# Patient Record
Sex: Female | Born: 1945 | Race: White | Hispanic: No | Marital: Single | Smoking: Never smoker
Health system: Southern US, Community
[De-identification: ages and names within clinical notes are randomized; demographics above are authoritative.]

## PROBLEM LIST (undated history)

## (undated) DIAGNOSIS — M1990 Unspecified osteoarthritis, unspecified site: Secondary | ICD-10-CM

## (undated) DIAGNOSIS — G2581 Restless legs syndrome: Secondary | ICD-10-CM

## (undated) DIAGNOSIS — G709 Myoneural disorder, unspecified: Secondary | ICD-10-CM

## (undated) HISTORY — PX: TONSILLECTOMY: SUR1361

## (undated) HISTORY — PX: JOINT REPLACEMENT: SHX530

## (undated) HISTORY — PX: BASAL CELL CARCINOMA EXCISION: SHX1214

---

## 2011-10-21 ENCOUNTER — Other Ambulatory Visit: Payer: Self-pay | Admitting: Orthopedic Surgery

## 2011-10-29 ENCOUNTER — Encounter (HOSPITAL_COMMUNITY): Payer: Self-pay | Admitting: Pharmacy Technician

## 2011-10-29 ENCOUNTER — Encounter (HOSPITAL_COMMUNITY)
Admission: RE | Admit: 2011-10-29 | Discharge: 2011-10-29 | Disposition: A | Discharge status: Home / Self Care | Payer: BC Managed Care – PPO | Source: Ambulatory Visit | Attending: Orthopaedic Surgery | Admitting: Orthopaedic Surgery

## 2011-10-29 ENCOUNTER — Encounter (HOSPITAL_COMMUNITY): Payer: Self-pay

## 2011-10-29 ENCOUNTER — Encounter (HOSPITAL_COMMUNITY)
Admission: RE | Admit: 2011-10-29 | Discharge: 2011-10-29 | Disposition: A | Discharge status: Home / Self Care | Payer: BC Managed Care – PPO | Source: Ambulatory Visit | Attending: Orthopedic Surgery | Admitting: Orthopedic Surgery

## 2011-10-29 ENCOUNTER — Other Ambulatory Visit: Payer: Self-pay

## 2011-10-29 HISTORY — DX: Unspecified osteoarthritis, unspecified site: M19.90

## 2011-10-29 HISTORY — DX: Restless legs syndrome: G25.81

## 2011-10-29 HISTORY — DX: Myoneural disorder, unspecified: G70.9

## 2011-10-29 LAB — DIFFERENTIAL
Basophils Absolute: 0.1 10*3/uL (ref 0.0–0.1)
Basophils Relative: 1 % (ref 0–1)
Eosinophils Absolute: 0.1 10*3/uL (ref 0.0–0.7)
Eosinophils Relative: 2 % (ref 0–5)
Lymphocytes Relative: 39 % (ref 12–46)
Lymphs Abs: 2.4 10*3/uL (ref 0.7–4.0)
Monocytes Absolute: 0.5 10*3/uL (ref 0.1–1.0)
Monocytes Relative: 8 % (ref 3–12)
Neutro Abs: 3 10*3/uL (ref 1.7–7.7)
Neutrophils Relative %: 50 % (ref 43–77)

## 2011-10-29 LAB — TYPE AND SCREEN
ABO/RH(D): A POS
Antibody Screen: NEGATIVE
Unit division: 0
Unit division: 0

## 2011-10-29 LAB — URINALYSIS, ROUTINE W REFLEX MICROSCOPIC
Bilirubin Urine: NEGATIVE
Glucose, UA: NEGATIVE mg/dL
Hgb urine dipstick: NEGATIVE
Ketones, ur: NEGATIVE mg/dL
Leukocytes, UA: NEGATIVE
Nitrite: NEGATIVE
Protein, ur: NEGATIVE mg/dL
Specific Gravity, Urine: 1.026 (ref 1.005–1.030)
Urobilinogen, UA: 0.2 mg/dL (ref 0.0–1.0)
pH: 5 (ref 5.0–8.0)

## 2011-10-29 LAB — COMPREHENSIVE METABOLIC PANEL
ALT: 14 U/L (ref 0–35)
AST: 17 U/L (ref 0–37)
Albumin: 4.1 g/dL (ref 3.5–5.2)
Alkaline Phosphatase: 73 U/L (ref 39–117)
BUN: 20 mg/dL (ref 6–23)
CO2: 27 mEq/L (ref 19–32)
Calcium: 9.9 mg/dL (ref 8.4–10.5)
Chloride: 107 mEq/L (ref 96–112)
Creatinine, Ser: 0.98 mg/dL (ref 0.50–1.10)
GFR calc Af Amer: 69 mL/min — ABNORMAL LOW (ref >90)
GFR calc non Af Amer: 60 mL/min — ABNORMAL LOW (ref >90)
Glucose, Bld: 157 mg/dL — ABNORMAL HIGH (ref 70–99)
Potassium: 4.3 mEq/L (ref 3.5–5.1)
Sodium: 142 mEq/L (ref 135–145)
Total Bilirubin: 0.3 mg/dL (ref 0.3–1.2)
Total Protein: 7.1 g/dL (ref 6.0–8.3)

## 2011-10-29 LAB — CBC
HCT: 39 % (ref 36.0–46.0)
Hemoglobin: 12.9 g/dL (ref 12.0–15.0)
MCH: 29.5 pg (ref 26.0–34.0)
MCHC: 33.1 g/dL (ref 30.0–36.0)
MCV: 89 fL (ref 78.0–100.0)
Platelets: 323 10*3/uL (ref 150–400)
RBC: 4.38 MIL/uL (ref 3.87–5.11)
RDW: 13 % (ref 11.5–15.5)
WBC: 6 10*3/uL (ref 4.0–10.5)

## 2011-10-29 LAB — SURGICAL PCR SCREEN
MRSA, PCR: NEGATIVE
Staphylococcus aureus: NEGATIVE

## 2011-10-29 LAB — APTT: aPTT: 33 seconds (ref 24–37)

## 2011-10-29 LAB — ABO/RH: ABO/RH(D): A POS

## 2011-10-29 LAB — PROTIME-INR
INR: 1 (ref 0.00–1.49)
Prothrombin Time: 13.4 seconds (ref 11.6–15.2)

## 2011-10-29 MED ORDER — CHLORHEXIDINE GLUCONATE 4 % EX LIQD
60.0000 mL | Freq: Every day | CUTANEOUS | Status: DC
  Filled 2011-10-29: qty 60

## 2011-10-29 NOTE — Pre-Procedure Instructions (Signed)
20 Cheyenne Perez  10/29/2011   Your procedure is scheduled on:  11/03/2011  Report to [facility] at 5:30 AM.  Call this number if you have problems the morning of surgery: [phone]   Remember:   Do not eat food:After Midnight.  May have clear liquids: up to 4 Hours before arrival.  Clear liquids include soda, tea, black coffee, apple or grape juice, broth.  Take these medicines the morning of surgery with A SIP OF WATER:    NOTHING   Do not wear jewelry, make-up or nail polish.  Do not wear lotions, powders, or perfumes. You may wear deodorant.  Do not shave 48 hours prior to surgery.  Do not bring valuables to the hospital.  Contacts, dentures or bridgework may not be worn into surgery.  Leave suitcase in the car. After surgery it may be brought to your room.  For patients admitted to the hospital, checkout time is 11:00 AM the day of discharge.   Patients discharged the day of surgery will not be allowed to drive home.  Name and phone number of your driver: Maggie, [place]  Special Instructions: CHG Shower Use Special Wash: 1/2 bottle night before surgery and 1/2 bottle morning of surgery.   Please read over the following fact sheets that you were given: Pain Booklet, Coughing and Deep Breathing, Blood Transfusion Information, MRSA Information and Surgical Site Infection Prevention

## 2011-10-29 NOTE — H&P (Signed)
NAME: Matteson Blue MRN: #[id number] DATE: October 21, 2011 DOB: 1945-12-15  CHIEF COMPLAINT:  Painful right hip.  HISTORY:  Ms. Krajewski is a very pleasant 66 year old white female who is seen today for evaluation of her right hip. She's had a previous history of a left total hip replacement back in 2009 by an [facility] physician, Dr. Arletha Grippe.   She did not have much in the way of any problems. However, she has noted over the last 6-8 months she's been having increasing pain and discomfort in the right hip and groin area. She is now having problems with sleeping at nighttime as well as activities of daily living. Her pain is now constant and quite severe, sharp and aching quality. It is exacerbated by activity and decreased by rest. She is having difficulty finding even any position that will make her comfortable. She does use crutches intermittently also. She is seen today for evaluation.   PAST MEDICAL HISTORY:  In general her health is good.  Hospitalizations include December 2009 for a left total hip replacement. Surgeries as above.  MEDICATIONS:  She takes Requip, dosage unknown, restless leg syndrome at nighttime. She states she takes diclofenac also for arthritis.   ALLERGIES:  None known.   REVIEW OF SYSTEMS: 14 point ROS Unremarkable except for restless leg syndrome which she has had for the past 4-5 years. She does use generic Requip for this. She denied all other pathology for the 14 point review of systems.   FAMILY HISTORY:  Not obtainable and her mother and father unknown.   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  She denies use of tobacco or alcohol.   EXAMINATION:    General:Her exam today reveals a very pleasant 66 year old white female who is well-developed and well-nourished. She is alert, pleasant and cooperative. She is in moderate distress secondary to right hip and groin pain.  Vital Signs:  She is 5 foot 5  inches and weighs 155 pounds. Temperature is 97.0. Pulse is 78. Respirations 18. Blood pressure 144/75.   Head:  Normocephalic.  Eyes: Pupils equal round and react to light and accommodation with extraocular movements intact.  Ears, nose and throat were benign.   Neck was supple and no bruits are noted.   Chest has good expansion. Lungs were clear to auscultation.   Cardiac had a regular rhythm and rate; normal S1-S2; no discrete murmurs or gallops appreciated. Her pulses were 2+ bilateral and symmetric in her lower extremities bilaterally.   CNS:  She's oriented x3 and cranial nerves II through XII are grossly intact.  Genital, Rectal, Breast exam not indicated for Orthopaedic evaluation   Musculoskeletal:  She has some minimal shortening on the right in comparison to the left at possibly quarter-inch. She has a no internal rotation on the right leg; external rotation to about 20 degrees only. She does have pain with range of motion of the right hip.   She has no pain with range of motion of her left hip.   CLINICAL IMPRESSION:   1. End-stage OA of the right hip. 2. Restless leg syndrome.  RECOMMENDATIONS:  At this time I have reviewed her note from Dr. Olena Leatherwood who feels that she would be able to tolerate a right total hip arthroplasty. Therefore we will plan on scheduling this right total hip arthroplasty. I've taken time today to review the procedure risks and benefits in detail with her. I have answered all her  questions. She would like to proceed with total hip arthroplasty on the right.  Oris Drone Santiago Bumpers, PA-C 10/29/2011 2:34 PM

## 2011-10-30 LAB — URINE CULTURE: Culture  Setup Time: 201301031239

## 2011-11-02 DIAGNOSIS — G2581 Restless legs syndrome: Secondary | ICD-10-CM

## 2011-11-02 DIAGNOSIS — M169 Osteoarthritis of hip, unspecified: Secondary | ICD-10-CM

## 2011-11-02 MED ORDER — CEFAZOLIN SODIUM 1-5 GM-% IV SOLN
1.0000 g | INTRAVENOUS | Status: AC
  Administered 2011-11-03: 1 g via INTRAVENOUS
  Filled 2011-11-02: qty 50

## 2011-11-03 ENCOUNTER — Ambulatory Visit (HOSPITAL_COMMUNITY): Payer: BC Managed Care – PPO

## 2011-11-03 ENCOUNTER — Encounter (HOSPITAL_COMMUNITY)
Admission: RE | Disposition: A | Discharge status: Home / Self Care | Payer: Self-pay | Source: Ambulatory Visit | Attending: Orthopaedic Surgery

## 2011-11-03 ENCOUNTER — Encounter (HOSPITAL_COMMUNITY): Payer: Self-pay

## 2011-11-03 ENCOUNTER — Ambulatory Visit (HOSPITAL_COMMUNITY): Payer: BC Managed Care – PPO | Admitting: Anesthesiology

## 2011-11-03 ENCOUNTER — Inpatient Hospital Stay (HOSPITAL_COMMUNITY)
Admission: RE | Admit: 2011-11-03 | Discharge: 2011-11-07 | DRG: 818 | Disposition: A | Discharge status: Home Health | Payer: BC Managed Care – PPO | Source: Ambulatory Visit | Attending: Orthopaedic Surgery | Admitting: Orthopaedic Surgery

## 2011-11-03 ENCOUNTER — Inpatient Hospital Stay (HOSPITAL_COMMUNITY): Payer: BC Managed Care – PPO

## 2011-11-03 ENCOUNTER — Encounter (HOSPITAL_COMMUNITY): Payer: Self-pay | Admitting: Anesthesiology

## 2011-11-03 DIAGNOSIS — Z01812 Encounter for preprocedural laboratory examination: Secondary | ICD-10-CM

## 2011-11-03 DIAGNOSIS — G2581 Restless legs syndrome: Secondary | ICD-10-CM | POA: Diagnosis present

## 2011-11-03 DIAGNOSIS — M169 Osteoarthritis of hip, unspecified: Secondary | ICD-10-CM

## 2011-11-03 DIAGNOSIS — E871 Hypo-osmolality and hyponatremia: Secondary | ICD-10-CM | POA: Diagnosis not present

## 2011-11-03 DIAGNOSIS — M1610 Unilateral primary osteoarthritis, unspecified hip: Principal | ICD-10-CM | POA: Diagnosis present

## 2011-11-03 DIAGNOSIS — Z01818 Encounter for other preprocedural examination: Secondary | ICD-10-CM

## 2011-11-03 DIAGNOSIS — Z01810 Encounter for preprocedural cardiovascular examination: Secondary | ICD-10-CM

## 2011-11-03 DIAGNOSIS — D62 Acute posthemorrhagic anemia: Secondary | ICD-10-CM | POA: Diagnosis not present

## 2011-11-03 HISTORY — PX: TOTAL HIP ARTHROPLASTY: SHX124

## 2011-11-03 LAB — CBC
HCT: 29.6 % — ABNORMAL LOW (ref 36.0–46.0)
Hemoglobin: 10.1 g/dL — ABNORMAL LOW (ref 12.0–15.0)
MCH: 29.5 pg (ref 26.0–34.0)
MCHC: 34.1 g/dL (ref 30.0–36.0)
MCV: 86.5 fL (ref 78.0–100.0)
Platelets: 205 10*3/uL (ref 150–400)
RBC: 3.42 MIL/uL — ABNORMAL LOW (ref 3.87–5.11)
RDW: 14 % (ref 11.5–15.5)
WBC: 10.6 10*3/uL — ABNORMAL HIGH (ref 4.0–10.5)

## 2011-11-03 LAB — GLUCOSE, CAPILLARY: Glucose-Capillary: 115 mg/dL — ABNORMAL HIGH (ref 70–99)

## 2011-11-03 SURGERY — ARTHROPLASTY, HIP, TOTAL,POSTERIOR APPROACH
Anesthesia: General | Site: Hip | Laterality: Right | Wound class: Clean

## 2011-11-03 MED ORDER — ROPINIROLE HCL 1 MG PO TABS
1.0000 mg | ORAL_TABLET | Freq: Every day | ORAL | Status: DC
  Administered 2011-11-03 – 2011-11-06 (×4): 1 mg via ORAL
  Filled 2011-11-03 (×5): qty 1

## 2011-11-03 MED ORDER — KETOROLAC TROMETHAMINE 15 MG/ML IJ SOLN: 15.0000 mg | Freq: Four times a day (QID) | INTRAMUSCULAR | Status: DC

## 2011-11-03 MED ORDER — RIVAROXABAN 10 MG PO TABS
10.0000 mg | ORAL_TABLET | ORAL | Status: DC
  Administered 2011-11-03 – 2011-11-06 (×4): 10 mg via ORAL
  Filled 2011-11-03 (×5): qty 1

## 2011-11-03 MED ORDER — ACETAMINOPHEN 10 MG/ML IV SOLN
1000.0000 mg | Freq: Four times a day (QID) | INTRAVENOUS | Status: AC
  Administered 2011-11-03 – 2011-11-04 (×3): 1000 mg via INTRAVENOUS
  Filled 2011-11-03 (×5): qty 100

## 2011-11-03 MED ORDER — EPHEDRINE SULFATE 50 MG/ML IJ SOLN
INTRAMUSCULAR | Status: DC | PRN
  Administered 2011-11-03: 5 mg via INTRAVENOUS

## 2011-11-03 MED ORDER — SODIUM CHLORIDE 0.9 % IV SOLN
INTRAVENOUS | Status: DC
  Administered 2011-11-03: 11:00:00 via INTRAVENOUS

## 2011-11-03 MED ORDER — ACETAMINOPHEN 10 MG/ML IV SOLN
INTRAVENOUS | Status: AC
Start: 2011-11-03 — End: 2011-11-03
  Filled 2011-11-03: qty 100

## 2011-11-03 MED ORDER — ONDANSETRON HCL 4 MG/2ML IJ SOLN: 4.0000 mg | Freq: Four times a day (QID) | INTRAMUSCULAR | Status: DC | PRN

## 2011-11-03 MED ORDER — DIPHENHYDRAMINE HCL 12.5 MG/5ML PO ELIX
12.5000 mg | ORAL_SOLUTION | Freq: Four times a day (QID) | ORAL | Status: DC | PRN
  Filled 2011-11-03: qty 5

## 2011-11-03 MED ORDER — HYDROMORPHONE HCL PF 1 MG/ML IJ SOLN: 0.2500 mg | INTRAMUSCULAR | Status: DC | PRN

## 2011-11-03 MED ORDER — BISACODYL 10 MG RE SUPP: 10.0000 mg | Freq: Every day | RECTAL | Status: DC | PRN

## 2011-11-03 MED ORDER — NEOSTIGMINE METHYLSULFATE 1 MG/ML IJ SOLN
INTRAMUSCULAR | Status: DC | PRN
  Administered 2011-11-03: 5 mg via INTRAVENOUS

## 2011-11-03 MED ORDER — OXYCODONE HCL 5 MG PO TABS
5.0000 mg | ORAL_TABLET | ORAL | Status: DC | PRN
  Administered 2011-11-04 – 2011-11-07 (×10): 10 mg via ORAL
  Filled 2011-11-03 (×10): qty 2

## 2011-11-03 MED ORDER — ACETAMINOPHEN 10 MG/ML IV SOLN
1000.0000 mg | INTRAVENOUS | Status: AC
  Administered 2011-11-03: 1000 mg via INTRAVENOUS
  Filled 2011-11-03: qty 100

## 2011-11-03 MED ORDER — HYDROMORPHONE 0.3 MG/ML IV SOLN
INTRAVENOUS | Status: DC
  Administered 2011-11-03: 11:00:00 via INTRAVENOUS
  Filled 2011-11-03: qty 25

## 2011-11-03 MED ORDER — DEXAMETHASONE SODIUM PHOSPHATE 10 MG/ML IJ SOLN
INTRAMUSCULAR | Status: DC | PRN
  Administered 2011-11-03: 10 mg via INTRAVENOUS

## 2011-11-03 MED ORDER — MEPERIDINE HCL 25 MG/ML IJ SOLN: 6.2500 mg | INTRAMUSCULAR | Status: DC | PRN

## 2011-11-03 MED ORDER — SODIUM CHLORIDE 0.9 % IJ SOLN: 9.0000 mL | INTRAMUSCULAR | Status: DC | PRN

## 2011-11-03 MED ORDER — PROMETHAZINE HCL 25 MG/ML IJ SOLN: 6.2500 mg | INTRAMUSCULAR | Status: DC | PRN

## 2011-11-03 MED ORDER — FENTANYL CITRATE 0.05 MG/ML IJ SOLN
INTRAMUSCULAR | Status: DC | PRN
  Administered 2011-11-03: 50 ug via INTRAVENOUS
  Administered 2011-11-03: 150 ug via INTRAVENOUS
  Administered 2011-11-03: 100 ug via INTRAVENOUS

## 2011-11-03 MED ORDER — ZOLPIDEM TARTRATE 5 MG PO TABS: 5.0000 mg | ORAL_TABLET | Freq: Every evening | ORAL | Status: DC | PRN

## 2011-11-03 MED ORDER — HETASTARCH-ELECTROLYTES 6 % IV SOLN
INTRAVENOUS | Status: DC | PRN
  Administered 2011-11-03 (×2): via INTRAVENOUS

## 2011-11-03 MED ORDER — GLYCOPYRROLATE 0.2 MG/ML IJ SOLN
INTRAMUSCULAR | Status: DC | PRN
  Administered 2011-11-03: .8 mg via INTRAVENOUS

## 2011-11-03 MED ORDER — GABAPENTIN 300 MG PO CAPS
300.0000 mg | ORAL_CAPSULE | Freq: Every day | ORAL | Status: DC
  Administered 2011-11-03 – 2011-11-06 (×4): 300 mg via ORAL
  Filled 2011-11-03 (×5): qty 1

## 2011-11-03 MED ORDER — ONDANSETRON HCL 4 MG/2ML IJ SOLN
INTRAMUSCULAR | Status: DC | PRN
  Administered 2011-11-03: 4 mg via INTRAVENOUS

## 2011-11-03 MED ORDER — SODIUM CHLORIDE 0.9 % IV SOLN: INTRAVENOUS | Status: DC

## 2011-11-03 MED ORDER — MENTHOL 3 MG MT LOZG: 1.0000 | LOZENGE | OROMUCOSAL | Status: DC | PRN

## 2011-11-03 MED ORDER — WHITE PETROLATUM GEL
Status: AC
  Filled 2011-11-03: qty 5

## 2011-11-03 MED ORDER — ONDANSETRON HCL 4 MG PO TABS
4.0000 mg | ORAL_TABLET | Freq: Four times a day (QID) | ORAL | Status: DC | PRN
  Administered 2011-11-04: 4 mg via ORAL
  Filled 2011-11-03: qty 1

## 2011-11-03 MED ORDER — LIDOCAINE HCL (CARDIAC) 20 MG/ML IV SOLN
INTRAVENOUS | Status: DC | PRN
  Administered 2011-11-03: 60 mg via INTRAVENOUS

## 2011-11-03 MED ORDER — CEFAZOLIN SODIUM 1-5 GM-% IV SOLN
1.0000 g | Freq: Four times a day (QID) | INTRAVENOUS | Status: AC
  Administered 2011-11-03 – 2011-11-04 (×3): 1 g via INTRAVENOUS
  Filled 2011-11-03 (×3): qty 50

## 2011-11-03 MED ORDER — PHENOL 1.4 % MT LIQD
1.0000 | OROMUCOSAL | Status: DC | PRN
  Filled 2011-11-03: qty 177

## 2011-11-03 MED ORDER — SODIUM CHLORIDE 0.9 % IR SOLN
Status: DC | PRN
  Administered 2011-11-03: 1000 mL

## 2011-11-03 MED ORDER — METHOCARBAMOL 100 MG/ML IJ SOLN
500.0000 mg | Freq: Four times a day (QID) | INTRAVENOUS | Status: DC | PRN
  Filled 2011-11-03: qty 5

## 2011-11-03 MED ORDER — MIDAZOLAM HCL 5 MG/5ML IJ SOLN
INTRAMUSCULAR | Status: DC | PRN
  Administered 2011-11-03 (×2): 1 mg via INTRAVENOUS

## 2011-11-03 MED ORDER — PROPOFOL 10 MG/ML IV EMUL
INTRAVENOUS | Status: DC | PRN
  Administered 2011-11-03: 140 mg via INTRAVENOUS

## 2011-11-03 MED ORDER — LACTATED RINGERS IV SOLN
INTRAVENOUS | Status: DC | PRN
  Administered 2011-11-03 (×2): via INTRAVENOUS

## 2011-11-03 MED ORDER — DEXTROSE 5 % IV SOLN
INTRAVENOUS | Status: DC | PRN
  Administered 2011-11-03: 08:00:00 via INTRAVENOUS

## 2011-11-03 MED ORDER — METHOCARBAMOL 500 MG PO TABS
500.0000 mg | ORAL_TABLET | Freq: Four times a day (QID) | ORAL | Status: DC | PRN
  Administered 2011-11-03 – 2011-11-06 (×5): 500 mg via ORAL
  Filled 2011-11-03 (×5): qty 1

## 2011-11-03 MED ORDER — PHENYLEPHRINE HCL 10 MG/ML IJ SOLN
INTRAMUSCULAR | Status: DC | PRN
  Administered 2011-11-03: 80 ug via INTRAVENOUS

## 2011-11-03 MED ORDER — DOCUSATE SODIUM 100 MG PO CAPS
100.0000 mg | ORAL_CAPSULE | Freq: Two times a day (BID) | ORAL | Status: DC
  Administered 2011-11-03 – 2011-11-07 (×9): 100 mg via ORAL
  Filled 2011-11-03 (×12): qty 1

## 2011-11-03 MED ORDER — DIPHENHYDRAMINE HCL 50 MG/ML IJ SOLN: 12.5000 mg | Freq: Four times a day (QID) | INTRAMUSCULAR | Status: DC | PRN

## 2011-11-03 MED ORDER — BUPIVACAINE-EPINEPHRINE PF 0.25-1:200000 % IJ SOLN
INTRAMUSCULAR | Status: DC | PRN
  Administered 2011-11-03: 30 mL

## 2011-11-03 MED ORDER — ROCURONIUM BROMIDE 100 MG/10ML IV SOLN
INTRAVENOUS | Status: DC | PRN
  Administered 2011-11-03: 50 mg via INTRAVENOUS
  Administered 2011-11-03 (×2): 10 mg via INTRAVENOUS

## 2011-11-03 MED ORDER — HYDROMORPHONE 0.3 MG/ML IV SOLN
INTRAVENOUS | Status: DC
  Administered 2011-11-03: 11:00:00 via INTRAVENOUS
  Administered 2011-11-03: 1 mg via INTRAVENOUS
  Administered 2011-11-03: 0.799 mg via INTRAVENOUS
  Administered 2011-11-04: 0.6 mg via INTRAVENOUS
  Administered 2011-11-04: 0.399 mg via INTRAVENOUS
  Administered 2011-11-04: 0.4 mg via INTRAVENOUS

## 2011-11-03 MED ORDER — ALUM & MAG HYDROXIDE-SIMETH 200-200-20 MG/5ML PO SUSP: 30.0000 mL | ORAL | Status: DC | PRN

## 2011-11-03 MED ORDER — NALOXONE HCL 0.4 MG/ML IJ SOLN: 0.4000 mg | INTRAMUSCULAR | Status: DC | PRN

## 2011-11-03 MED ORDER — CHLORHEXIDINE GLUCONATE 4 % EX LIQD: 60.0000 mL | Freq: Once | CUTANEOUS | Status: DC

## 2011-11-03 MED ORDER — METOCLOPRAMIDE HCL 5 MG/ML IJ SOLN
5.0000 mg | Freq: Three times a day (TID) | INTRAMUSCULAR | Status: DC | PRN
  Filled 2011-11-03: qty 2

## 2011-11-03 MED ORDER — POLYETHYLENE GLYCOL 3350 17 G PO PACK
17.0000 g | PACK | Freq: Every day | ORAL | Status: DC | PRN
  Administered 2011-11-04 – 2011-11-05 (×2): 17 g via ORAL
  Filled 2011-11-03 (×2): qty 1

## 2011-11-03 MED ORDER — METOCLOPRAMIDE HCL 10 MG PO TABS: 5.0000 mg | ORAL_TABLET | Freq: Three times a day (TID) | ORAL | Status: DC | PRN

## 2011-11-03 SURGICAL SUPPLY — 62 items
BLADE SAW SAG 73X25 THK (BLADE) ×1
BLADE SAW SGTL 73X25 THK (BLADE) ×1 IMPLANT
BRUSH FEMORAL CANAL (MISCELLANEOUS) IMPLANT
CLOTH BEACON ORANGE TIMEOUT ST (SAFETY) ×2 IMPLANT
COVER BACK TABLE 24X17X13 BIG (DRAPES) ×2 IMPLANT
COVER SURGICAL LIGHT HANDLE (MISCELLANEOUS) ×2 IMPLANT
CUP ACETBLR 52 OD PINNACLE (Hips) ×2 IMPLANT
DRAPE INCISE IOBAN 66X45 STRL (DRAPES) ×2 IMPLANT
DRAPE ORTHO SPLIT 77X108 STRL (DRAPES) ×2
DRAPE SURG ORHT 6 SPLT 77X108 (DRAPES) ×2 IMPLANT
DRSG MEPILEX BORDER 4X12 (GAUZE/BANDAGES/DRESSINGS) ×2 IMPLANT
DURAPREP 26ML APPLICATOR (WOUND CARE) ×2 IMPLANT
ELECT BLADE 6.5 EXT (BLADE) ×2 IMPLANT
ELECT REM PT RETURN 9FT ADLT (ELECTROSURGICAL) ×2
ELECTRODE REM PT RTRN 9FT ADLT (ELECTROSURGICAL) ×1 IMPLANT
ELIMINATOR HOLE APEX DEPUY (Hips) ×2 IMPLANT
EVACUATOR 1/8 PVC DRAIN (DRAIN) IMPLANT
FACESHIELD LNG OPTICON STERILE (SAFETY) ×4 IMPLANT
FEMORAL HEAD METAL ON METAL (Head) ×2 IMPLANT
Femoral Stem 12mm Small (Orthopedic Implant) ×2 IMPLANT
GLOVE BIOGEL PI IND STRL 8 (GLOVE) ×2 IMPLANT
GLOVE BIOGEL PI IND STRL 8.5 (GLOVE) ×1 IMPLANT
GLOVE BIOGEL PI INDICATOR 8 (GLOVE) ×2
GLOVE BIOGEL PI INDICATOR 8.5 (GLOVE) ×1
GLOVE ECLIPSE 8.0 STRL XLNG CF (GLOVE) ×2 IMPLANT
GLOVE SURG ORTHO 8.5 STRL (GLOVE) ×4 IMPLANT
GOWN PREVENTION PLUS XLARGE (GOWN DISPOSABLE) ×4 IMPLANT
GOWN STRL NON-REIN LRG LVL3 (GOWN DISPOSABLE) ×4 IMPLANT
HANDPIECE INTERPULSE COAX TIP (DISPOSABLE)
IMMOBILIZER KNEE 20 (SOFTGOODS)
IMMOBILIZER KNEE 20 THIGH 36 (SOFTGOODS) IMPLANT
IMMOBILIZER KNEE 22 UNIV (SOFTGOODS) IMPLANT
IMMOBILIZER KNEE 24 THIGH 36 (MISCELLANEOUS) IMPLANT
IMMOBILIZER KNEE 24 UNIV (MISCELLANEOUS)
KIT BASIN OR (CUSTOM PROCEDURE TRAY) ×2 IMPLANT
KIT ROOM TURNOVER OR (KITS) ×2 IMPLANT
LINER MARATHON 4MM 10DEG 36X52 (Hips) ×2 IMPLANT
MANIFOLD NEPTUNE II (INSTRUMENTS) ×2 IMPLANT
NEEDLE 22X1 1/2 (OR ONLY) (NEEDLE) ×2 IMPLANT
NS IRRIG 1000ML POUR BTL (IV SOLUTION) ×2 IMPLANT
PACK TOTAL JOINT (CUSTOM PROCEDURE TRAY) ×2 IMPLANT
PAD ARMBOARD 7.5X6 YLW CONV (MISCELLANEOUS) ×4 IMPLANT
PRESSURIZER FEMORAL UNIV (MISCELLANEOUS) IMPLANT
SCREW PINN CAN 6.5X20 (Screw) ×2 IMPLANT
SET HNDPC FAN SPRY TIP SCT (DISPOSABLE) IMPLANT
STAPLER VISISTAT 35W (STAPLE) ×2 IMPLANT
SUCTION FRAZIER TIP 10 FR DISP (SUCTIONS) ×2 IMPLANT
SUT BONE WAX W31G (SUTURE) IMPLANT
SUT ETHIBOND NAB CT1 #1 30IN (SUTURE) ×6 IMPLANT
SUT MNCRL AB 3-0 PS2 18 (SUTURE) ×2 IMPLANT
SUT VIC AB 0 CT1 27 (SUTURE) ×2
SUT VIC AB 0 CT1 27XBRD ANBCTR (SUTURE) ×2 IMPLANT
SUT VIC AB 1 CT1 27 (SUTURE) ×2
SUT VIC AB 1 CT1 27XBRD ANBCTR (SUTURE) ×2 IMPLANT
SUT VIC AB 2-0 CT1 27 (SUTURE) ×2
SUT VIC AB 2-0 CT1 TAPERPNT 27 (SUTURE) ×2 IMPLANT
SYR CONTROL 10ML LL (SYRINGE) ×2 IMPLANT
TOWEL OR 17X24 6PK STRL BLUE (TOWEL DISPOSABLE) ×2 IMPLANT
TOWEL OR 17X26 10 PK STRL BLUE (TOWEL DISPOSABLE) ×2 IMPLANT
TOWER CARTRIDGE SMART MIX (DISPOSABLE) IMPLANT
TRAY FOLEY CATH 14FR (SET/KITS/TRAYS/PACK) ×2 IMPLANT
WATER STERILE IRR 1000ML POUR (IV SOLUTION) ×2 IMPLANT

## 2011-11-03 NOTE — Anesthesia Preprocedure Evaluation (Addendum)
Anesthesia Evaluation  Patient identified by MRN, date of birth, ID band Patient awake    Reviewed: Allergy & Precautions, H&P , NPO status , Patient's Chart, lab work & pertinent test results  Airway Mallampati: II TM Distance: >3 FB Neck ROM: Full    Dental No notable dental hx.    Pulmonary neg pulmonary ROS,  clear to auscultation  Pulmonary exam normal       Cardiovascular neg cardio ROS Regular Normal    Neuro/Psych  Neuromuscular disease Negative Neurological ROS  Negative Psych ROS   GI/Hepatic negative GI ROS, Neg liver ROS,   Endo/Other  Negative Endocrine ROS  Renal/GU negative Renal ROS  Genitourinary negative   Musculoskeletal   Abdominal   Peds  Hematology negative hematology ROS (+)   Anesthesia Other Findings   Reproductive/Obstetrics negative OB ROS                        Anesthesia Physical Anesthesia Plan  ASA: II  Anesthesia Plan: General   Post-op Pain Management:    Induction: Intravenous  Airway Management Planned: Oral ETT  Additional Equipment:   Intra-op Plan:   Post-operative Plan: Extubation in OR  Informed Consent: I have reviewed the patients History and Physical, chart, labs and discussed the procedure including the risks, benefits and alternatives for the proposed anesthesia with the patient or authorized representative who has indicated his/her understanding and acceptance.   Dental advisory given  Plan Discussed with: Anesthesiologist and Surgeon  Anesthesia Plan Comments:         Anesthesia Quick Evaluation

## 2011-11-03 NOTE — Anesthesia Postprocedure Evaluation (Signed)
  Anesthesia Post-op Note  Patient: Cheyenne Perez  Procedure(s) Performed:  TOTAL HIP ARTHROPLASTY  Patient Location: PACU  Anesthesia Type: General  Level of Consciousness: awake  Airway and Oxygen Therapy: Patient Spontanous Breathing and Patient connected to nasal cannula oxygen  Post-op Pain: mild  Post-op Assessment: Post-op Vital signs reviewed, Patient's Cardiovascular Status Stable, Respiratory Function Stable and Patent Airway  Post-op Vital Signs: Reviewed and stable  Complications: No apparent anesthesia complications

## 2011-11-03 NOTE — Brief Op Note (Signed)
PATIENT ID:      Cheyenne Perez  MRN:     [id number] DOB/AGE:    65-Jun-1947 / 65 y.o.       OPERATIVE REPORT    DATE OF PROCEDURE:  11/03/2011       PREOPERATIVE DIAGNOSIS:   osteoarthritis right hip                                                       There is no height or weight on file to calculate BMI.     POSTOPERATIVE DIAGNOSIS:   osteoarthritis right hip                                                                     There is no height or weight on file to calculate BMI.     PROCEDURE:  Procedure(s): TOTAL HIP ARTHROPLASTY     SURGEON:  Norlene Campbell, MD    ASSISTANT:   Jacqualine Code, PA-C   (Present and scrubbed throughout the case, critical for assistance with exposure, retraction, instrumentation, and closure.)          ANESTHESIA: General     DRAINS: none :      TOURNIQUET TIME: * No tourniquets in log *    COMPLICATIONS:  None    CONDITION:  stable   Cheyenne Perez 11/03/2011, 10:07 AM

## 2011-11-03 NOTE — Transfer of Care (Signed)
Immediate Anesthesia Transfer of Care Note  Patient: Cheyenne Perez  Procedure(s) Performed:  TOTAL HIP ARTHROPLASTY  Patient Location: PACU  Anesthesia Type: General  Level of Consciousness: awake and oriented  Airway & Oxygen Therapy: Patient Spontanous Breathing and Patient connected to nasal cannula oxygen  Post-op Assessment: Report given to PACU RN, Post -op Vital signs reviewed and stable and Patient moving all extremities  Post vital signs: Reviewed and stable  Complications: No apparent anesthesia complications

## 2011-11-03 NOTE — H&P (Signed)
  The recent History & Physical has been reviewed. I have personally examined the patient today. There is no interval change to the documented History & Physical. The patient would like to proceed with the procedure.  Norlene Campbell W 11/03/2011,  7:31 AM

## 2011-11-03 NOTE — H&P (Signed)
There has been no change in health status since  the current H&P.I have examined the patient and discussed the surgery. No contraindications to the planned procedure exist.    There has been no change in health status since  the current H&P.I have examined the patient and discussed the surgery. No contraindications to the planned procedure exist.

## 2011-11-03 NOTE — Anesthesia Procedure Notes (Signed)
Procedure Name: Intubation Date/Time: 11/03/2011 7:43 AM Performed by: Julianne Rice K Pre-anesthesia Checklist: Patient identified, Timeout performed, Emergency Drugs available, Suction available and Patient being monitored Patient Re-evaluated:Patient Re-evaluated prior to inductionOxygen Delivery Method: Circle System Utilized Preoxygenation: Pre-oxygenation with 100% oxygen Intubation Type: IV induction Ventilation: Mask ventilation without difficulty Laryngoscope Size: Miller and 2 Grade View: Grade II Tube type: Oral Tube size: 7.5 mm Number of attempts: 1 Airway Equipment and Method: stylet Placement Confirmation: ETT inserted through vocal cords under direct vision,  positive ETCO2 and breath sounds checked- equal and bilateral Secured at: 21 cm Tube secured with: Tape Dental Injury: Teeth and Oropharynx as per pre-operative assessment

## 2011-11-03 NOTE — Op Note (Signed)
PATIENT ID:      Cheyenne Perez  MRN:     [id number] DOB/AGE:    [birth year] / 66 y.o.       OPERATIVE REPORT   DATE OF PROCEDURE:  11/03/2011       PREOPERATIVE DIAGNOSIS:   osteoarthritis right hip                                                       There is no height or weight on file to calculate BMI.     POSTOPERATIVE DIAGNOSIS:   osteoarthritis right hip                                                                     There is no height or weight on file to calculate BMI.     PROCEDURE:  Procedure(s):RIGHT TOTAL HIP ARTHROPLASTY     SURGEON: Norlene Campbell, MD  11/03/2011, 9:40 AM    ASSISTANT:   Jacqualine Code, PA-C   (Present and scrubbed throughout the case, critical for assistance with exposure, retraction, instrumentation, and closure.)          ANESTHESIA:general     DRAINS: none    COMPLICATIONS:  None        COMPONENTS:  DePuy AML small stature 12-mm femoral component         A 36 -mm  outer diameter hip ball          A 5 mm neck length                               A 52-mm outer  diameter sector IIl Porocoat acetabular shell with an apex hole eliminator          A pedicle Marathon polyethylene liner  +4, 10-degree posterior lip.           Components were Press-Fit.                                A single 6.25mmx 20mm acetabular screw  PROCEDURE IN DETAIL:  The patient was met in the holding area and personally  identified.  The appropriate hip was identified and marked at the operative site.The patient was then transported to room #1 and placed under  General anesthesia. A foley catheter was inserted by the nursing staff.Urine was clear.  At that point, the patient was placed in the lateral decubitus position with the operative side up and secured to the operating room table with the Innomed hip system.      The operative lower extremity was prepped from the iliac crest to the distal leg with Betadine scrub and then DuraPrep.  Sterile draping was performed.      A  routine southern incision was utilized and via sharp dissection carried down to the subcutaneous tissue.  Gross bleeders were Bovie coagulated.  The iliotibial band was identified and incised along the length of the skin incision.  Self-retaining retractors were  inserted.  With the hip internally rotated, the short external rotators were identified. Tendinous structures were tagged with 0 Ethibond suture.  The hip capsule was identified and incised along the femoral neck and head.  There was a small clear yellow joint effusion.  Hip was easily dislocated posteriorly.  The femoral neck was then osteotomized using a calcar guide and removed from the wound.  Synovectomy was  performed from the acetabulum. There was a moderate beefy red synovitis.      The osteotomy was placed about 10 mm proximal to the lesser trochanter.  A starter hole was then made through the piriformis fossa.  Reaming was performed to 12mm..  I had nice endosteal  purchase.  Rasping was performed sequentially to 12 mm.A small non propagating crack in the calcar was identified.      Retractors were then placed about the acetabulum.  Further synovectomy was performed.  There was a large degenerative labrum that was also excised.  .  Reaming was performed sequentially to 51 mm to accept a 52mm component. It had very nice bleeding circumferentially and a nice strong thick acetabulum.  I then trialed the acetabular component.  It had complete seating.  Accordingly, acetabular component was impacted into the acetabulum.  It was a very nice fit and stable.       The trial polyethylene liner was inserted followed by the femoral rasp.  We trialed a     number of neck lengths and felt like trial 1.85mm neck and ball was the most stable.  At that point, there was minimal toggling and incomplete stability in extension The acetabular component was repositioned with resultant stability.  Leg lengths were appropriate.     The trial components were then  removed.  The joint was copiously irrigated with saline solution.  Apex hole eliminator was inserted into the acetabular component followed a 6.27mmx20mm acetabular screw. There was minimal protrusion palpated in the posterior acetabulum. The final Marathon polyethylene liner.was inserted without issue.      The femoral component was then impacted onto the calcar.  Wound was again   irrigated.  We again trialed using the 36mm head and felt that it was perfectly stable with a 5mm neck length The right leg seemed about 2-33mm longer, but more stable with the 5mm neck..      The trial head was then removed.  We cleaned the [facility] taper neck and inserted the final head.  This was reduced, and through a full range of motion, it was perfectly  Stable  and there was no subluxation.  There was no evidence of instability.  It had a very nice construct.      Wound was then irrigated with saline solution.  The capsule was closed anatomically with #1 Ethibond.  The short external rotators were closed with similar material.  The wound was again irrigated with saline solution.  The iliotibial band was closed with  running #1 Vicryl, subcu was closed with 2-0 Vicryl and 3-0 Monocryl, skin was closed with skin clips.  Sterile bulky dressing was applied followed by a knee immobilizer.  The patient was then placed in the supine position, awoken, placed on the operating  stretcher, and returned to the  postanesthesia recovery room in satisfactory condition.     Norlene Campbell, MD  11/03/2011, 9:40 AM  11/03/2011 9:40 AM

## 2011-11-03 NOTE — Preoperative (Signed)
Beta Blockers   Reason not to administer Beta Blockers:Not Applicable 

## 2011-11-04 ENCOUNTER — Encounter (HOSPITAL_COMMUNITY): Payer: Self-pay | Admitting: Orthopaedic Surgery

## 2011-11-04 DIAGNOSIS — D62 Acute posthemorrhagic anemia: Secondary | ICD-10-CM | POA: Diagnosis not present

## 2011-11-04 LAB — CBC
HCT: 25.7 % — ABNORMAL LOW (ref 36.0–46.0)
HCT: 27.2 % — ABNORMAL LOW (ref 36.0–46.0)
Hemoglobin: 8.7 g/dL — ABNORMAL LOW (ref 12.0–15.0)
Hemoglobin: 9.1 g/dL — ABNORMAL LOW (ref 12.0–15.0)
MCH: 29.5 pg (ref 26.0–34.0)
MCH: 29.4 pg (ref 26.0–34.0)
MCHC: 33.9 g/dL (ref 30.0–36.0)
MCHC: 33.5 g/dL (ref 30.0–36.0)
MCV: 87.1 fL (ref 78.0–100.0)
MCV: 87.7 fL (ref 78.0–100.0)
Platelets: 213 10*3/uL (ref 150–400)
Platelets: 221 10*3/uL (ref 150–400)
RBC: 2.95 MIL/uL — ABNORMAL LOW (ref 3.87–5.11)
RBC: 3.1 MIL/uL — ABNORMAL LOW (ref 3.87–5.11)
RDW: 14.8 % (ref 11.5–15.5)
RDW: 14.9 % (ref 11.5–15.5)
WBC: 9.6 10*3/uL (ref 4.0–10.5)
WBC: 12.5 10*3/uL — ABNORMAL HIGH (ref 4.0–10.5)

## 2011-11-04 LAB — BASIC METABOLIC PANEL
BUN: 10 mg/dL (ref 6–23)
CO2: 27 mEq/L (ref 19–32)
Calcium: 7.8 mg/dL — ABNORMAL LOW (ref 8.4–10.5)
Chloride: 106 mEq/L (ref 96–112)
Creatinine, Ser: 0.8 mg/dL (ref 0.50–1.10)
GFR calc Af Amer: 88 mL/min — ABNORMAL LOW (ref >90)
GFR calc non Af Amer: 76 mL/min — ABNORMAL LOW (ref >90)
Glucose, Bld: 136 mg/dL — ABNORMAL HIGH (ref 70–99)
Potassium: 4 mEq/L (ref 3.5–5.1)
Sodium: 138 mEq/L (ref 135–145)

## 2011-11-04 MED ORDER — ACETAMINOPHEN 10 MG/ML IV SOLN
1000.0000 mg | Freq: Four times a day (QID) | INTRAVENOUS | Status: AC
  Administered 2011-11-04 – 2011-11-05 (×3): 1000 mg via INTRAVENOUS
  Filled 2011-11-04 (×5): qty 100

## 2011-11-04 NOTE — Progress Notes (Signed)
Occupational Therapy Evaluation Patient Details Name: Cheyenne Perez MRN: [id number] DOB: August 24, 1946 Today's Date: 11/04/2011  Problem List:  Patient Active Problem List  Diagnoses  . Osteoarthritis of hip  . Restless leg syndrome  . Postoperative anemia due to acute blood loss    Past Medical History:  Past Medical History  Diagnosis Date  . Diabetes mellitus     no meds yet, told that she has borderline concern  . Neuromuscular disorder     neuropathy- R side pain- hip- down leg  . Arthritis     osteo- hip-R  . Restless leg    Past Surgical History:  Past Surgical History  Procedure Date  . Joint replacement     L hip- 2009  . Basal cell carcinoma excision     L lower eyelid  . Tonsillectomy     as a child  . Total hip arthroplasty 11/03/2011    Procedure: TOTAL HIP ARTHROPLASTY;  Surgeon: Valeria Batman, MD;  Location: [facility] OR;  Service: Orthopedics;  Laterality: Right;    OT Assessment/Plan/Recommendation OT Assessment Clinical Impression Statement: Pt. with Rt. THA and living alone PTA, resulting in pt. needing to be mod I or supervision at D/C to facilitate safe D/C home with pt's nephew coming to check on her intermittently during the day. OT Recommendation/Assessment: Patient will need skilled OT in the acute care venue OT Problem List: Decreased activity tolerance;Pain;Decreased knowledge of use of DME or AE;Decreased knowledge of precautions;Decreased safety awareness Barriers to Discharge: None OT Therapy Diagnosis : Acute pain OT Plan OT Frequency: Min 2X/week OT Treatment/Interventions: Self-care/ADL training;DME and/or AE instruction;Patient/family education;Balance training OT Recommendation Follow Up Recommendations: Home health OT;Supervision - Intermittent Equipment Recommended: None recommended by OT Individuals Consulted Consulted and Agree with Results and Recommendations: Patient OT Goals Acute Rehab OT Goals OT Goal Formulation: With  patient Time For Goal Achievement: 2 weeks ADL Goals Pt Will Perform Grooming: with modified independence;Standing at sink ADL Goal: Grooming - Progress: Progressing toward goals Pt Will Perform Lower Body Bathing: with supervision;with set-up;Sit to stand from bed ADL Goal: Lower Body Bathing - Progress: Not met Pt Will Perform Lower Body Dressing: with set-up;with supervision;with adaptive equipment;Sit to stand from bed ADL Goal: Lower Body Dressing - Progress: Progressing toward goals Pt Will Transfer to Toilet: with modified independence;Ambulation;with DME;3-in-1 ADL Goal: Toilet Transfer - Progress: Progressing toward goals Pt Will Perform Toileting - Hygiene: with modified independence;Standing at 3-in-1/toilet ADL Goal: Toileting - Hygiene - Progress: Progressing toward goals  OT Evaluation Precautions/Restrictions  Precautions Precautions: Posterior Hip Precaution Booklet Issued: No Required Braces or Orthoses: Yes Knee Immobilizer: Other (comment) (Rt LE while in bed) Restrictions Weight Bearing Restrictions: Yes RLE Weight Bearing: Weight bearing as tolerated Prior Functioning Home Living Lives With: Alone Receives Help From: Family Type of Home: House Home Layout: One level Home Access: Stairs to enter Secretary/administrator of Steps: 1 Bathroom Shower/Tub: Other (comment) (Pt. sponge bathes) Bathroom Toilet: Standard Bathroom Accessibility: Yes How Accessible: Accessible via walker Home Adaptive Equipment: Walker - standard Prior Function Level of Independence: Independent with basic ADLs;Independent with homemaking with ambulation;Independent with gait;Independent with transfers Able to Take Stairs?: Yes Driving: Yes ADL ADL Eating/Feeding: Simulated;Independent Where Assessed - Eating/Feeding: Chair Grooming: Performed Where Assessed - Grooming: Sitting, chair Upper Body Bathing: Simulated;Chest;Right arm;Left arm;Abdomen;Modified independent Where  Assessed - Upper Body Bathing: Sitting, chair Lower Body Bathing: Moderate assistance Where Assessed - Lower Body Bathing: Sit to stand from chair Upper Body Dressing: Performed;Set up  Upper Body Dressing Details (indicate cue type and reason): with donning gown Where Assessed - Upper Body Dressing: Sitting, chair Lower Body Dressing: Simulated;Maximal assistance Lower Body Dressing Details (indicate cue type and reason): Educated pt. Re- AE Where Assessed - Lower Body Dressing: Sitting, chair Toilet Transfer: Minimal assistance;Performed Toilet Transfer Details (indicate cue type and reason): Mod verbal cues for hand placement and technique for maintaining precautions and safety with hand placement Toilet Transfer Method: Ambulating Toilet Transfer Equipment: Bedside commode (commode over regular toilet) Toileting - Clothing Manipulation: Performed;Minimal assistance Toileting - Clothing Manipulation Details (indicate cue type and reason): With moving gown Where Assessed - Toileting Clothing Manipulation: Sit to stand from 3-in-1 or toilet Toileting - Hygiene: Performed;Set up Where Assessed - Toileting Hygiene: Standing Tub/Shower Transfer: Not assessed Tub/Shower Transfer Method: Not assessed Equipment Used: Rolling walker Ambulation Related to ADLs: Pt. provided with close supervision with mobility in the room with RW use and min verbal cues for WBAT Rt. LE. ADL Comments: Pt. educated on techniques for completing LB ADLs with AE to increase independence.   Extremity Assessment RUE Assessment RUE Assessment: Within Functional Limits LUE Assessment LUE Assessment: Within Functional Limits Mobility  Bed Mobility Bed Mobility: No Supine to Sit: Not tested (comment) Sitting - Scoot to Edge of Bed: Not tested (comment) Sit to Supine: 4: Min assist Sit to Supine - Details (indicate cue type and reason): Assist for right LE to maintain posterior hip precautions.  Cues for  sequence. Transfers Transfers: Yes Sit to Stand: 4: Min assist Sit to Stand Details (indicate cue type and reason): Guarding for balance with cues for hand and right LE placement. Stand to Sit: 4: Min assist (Min (guard)) Stand to Sit Details: Guarding for balance with cues for hand and right LE placement. Exercises Total Joint Exercises Ankle Circles/Pumps: AROM;Right;10 reps;Supine Quad Sets: AROM;Right;10 reps;Supine Gluteal Sets: AROM;Both;10 reps;Supine Heel Slides: AAROM;Right;10 reps;Supine Hip ABduction/ADduction: AAROM;Right;10 reps;Supine End of Session OT - End of Session Equipment Utilized During Treatment: Gait belt Activity Tolerance: Patient tolerated treatment well Patient left: in chair;with call bell in reach Nurse Communication: Mobility status for transfers General Behavior During Session: [facility] for tasks performed Cognition: [facility] for tasks performed   Liliana Brentlinger, OTR/L Pager [phone] 11/04/2011, 2:56 PM

## 2011-11-04 NOTE — Progress Notes (Signed)
Utilization review completed. Trishna Cwik, RN, BSN. 11/04/11  

## 2011-11-04 NOTE — Progress Notes (Signed)
Pharmacy- Xarelto patient education was completed.  Pt expressed understanding.

## 2011-11-04 NOTE — Progress Notes (Signed)
Orthopedic Tech Progress Note Patient Details:  Cheyenne Perez Jul 18, 1946 [id number]      [name] 11/04/2011, 6:51 PM Applied overhead frame to patient bed

## 2011-11-04 NOTE — Progress Notes (Signed)
Patient ID: Cheyenne Perez, female   DOB: 1945/11/04, 66 y.o.   MRN: [id number] PATIENT ID:      Cheyenne Perez  MRN:     [id number] DOB/AGE:    07/01/1946 / 66 y.o.    PROGRESS NOTE Subjective:  negative for Chest Pain  negative for Shortness of Breath  negative for Nausea/Vomiting   negative for Calf Pain  positive for Bowel Movement   Tolerating Diet: yes         Patient reports pain as mild.    Objective: Vital signs in last 24 hours:  Patient Vitals for the past 24 hrs:  BP Temp Temp src Pulse Resp SpO2  11/04/11 0543 120/50 mmHg 99.1 F (37.3 C) - 90  18  97 %  11/04/11 0215 120/58 mmHg 98 F (36.7 C) - 70  18  96 %  11/04/11 0005 - - - - 16  -  11/03/11 2324 - - - - 16  -  11/03/11 2144 118/53 mmHg 98.1 F (36.7 C) - 87  18  94 %  11/03/11 1623 148/67 mmHg 97.7 F (36.5 C) Oral 72  16  100 %  11/03/11 1600 - - - - 16  98 %  11/03/11 1238 140/56 mmHg 97.6 F (36.4 C) - 86  18  100 %  11/03/11 1045 157/68 mmHg 97.4 F (36.3 C) - 81  16  100 %  11/03/11 1030 159/68 mmHg - - 85  14  100 %  11/03/11 1025 153/68 mmHg - - - - -  11/03/11 1015 - - - 93  20  100 %  11/03/11 1010 165/65 mmHg 97.6 F (36.4 C) - 95  31  100 %      Intake/Output from previous day:   01/08 0701 - 01/09 0700 In: 4270 [P.O.:120; I.V.:2600] Out: 4000 [Urine:2900]   Intake/Output this shift:       Intake/Output      01/08 0701 - 01/09 0700 01/09 0701 - 01/10 0700   P.O. 120    I.V. 2600    Blood 350    IV Piggyback 1200    Total Intake 4270    Urine 2900    Blood 1100    Total Output 4000    Net +270            LABORATORY DATA:  Basename 11/04/11 0720 11/03/11 1020 10/29/11 1056  WBC 9.6 10.6* 6.0  HGB 8.7* 10.1* 12.9  HCT 25.7* 29.6* 39.0  PLT 213 205 323    Basename 10/29/11 1056  NA 142  K 4.3  CL 107  CO2 27  BUN 20  CREATININE 0.98  GLUCOSE 157*  CALCIUM 9.9   Lab Results  Component Value Date   INR 1.00 10/29/2011    Examination:  General appearance: alert  and mild distress Resp: clear to auscultation bilaterally Cardio: regular rate and rhythm GI: normal findings: bowel sounds normal  Wound Exam: clean, dry, intact   Drainage:  None: wound tissue dry  Motor Exam EHL, FHL, Anterior Tibial and Posterior Tibial Intact  Sensory Exam Superficial Peroneal, Deep Peroneal and Tibial normal  Assessment:    1 Day Post-Op  Procedure(s) (LRB): TOTAL HIP ARTHROPLASTY (Right)  ADDITIONAL DIAGNOSIS:  Principal Problem:  *Osteoarthritis of hip Active Problems:  Restless leg syndrome  Postoperative anemia due to acute blood loss  Acute Blood Loss Anemia   Plan: Physical Therapy as ordered Weight Bearing as Tolerated (WBAT)  DVT Prophylaxis:  Xarelto,  Foot Pumps and TED hose  DISCHARGE PLAN: Home  DISCHARGE NEEDS: HHPT, Walker and 3-in-1 comode seat  REPEAT CBC AT 3PM SALINE LOCK IV CONTINUE OFIRMEV D/C O2         Cheyenne Perez 11/04/2011, 8:10 AM

## 2011-11-04 NOTE — Progress Notes (Signed)
CARE MANAGEMENT NOTE 11/04/2011 Discharge planning. Spoke with patient, Choice offered. Patient has used [facility] in [place]. Contacted Ava @ [employer]-[phone]. PT will be avail on Monday 11/09/11.Marland Kitchen Patient will be discharged on Xarelto 10 mg., contacted her pharmacy- [facility] in [phone], spoke with Misty Stanley. They have Xarelto 10 mg. in stock.

## 2011-11-04 NOTE — Progress Notes (Signed)
Physical Therapy Evaluation Patient Details Name: Cheyenne Perez MRN: [id number] DOB: [birth year] Today's Date: 11/04/2011  Problem List:  Patient Active Problem List  Diagnoses  . Osteoarthritis of hip  . Restless leg syndrome  . Postoperative anemia due to acute blood loss    Past Medical History:  Past Medical History  Diagnosis Date  . Diabetes mellitus     no meds yet, told that she has borderline concern  . Neuromuscular disorder     neuropathy- R side pain- hip- down leg  . Arthritis     osteo- hip-R  . Restless leg    Past Surgical History:  Past Surgical History  Procedure Date  . Joint replacement     L hip- 2009  . Basal cell carcinoma excision     L lower eyelid  . Tonsillectomy     as a child  . Total hip arthroplasty 11/03/2011    Procedure: TOTAL HIP ARTHROPLASTY;  Surgeon: Valeria Batman, MD;  Location: [facility] OR;  Service: Orthopedics;  Laterality: Right;    PT Assessment/Plan/Recommendation PT Assessment Clinical Impression Statement: Pt is a 66 y/o female admitted s/p right THA along with the below PT problem list.  Pt would benefit from acute PT to maximize independence and facilitate d/c home with HHPT. PT Recommendation/Assessment: Patient will need skilled PT in the acute care venue PT Problem List: Decreased strength;Decreased activity tolerance;Decreased balance;Decreased mobility;Decreased knowledge of use of DME;Decreased knowledge of precautions;Pain Barriers to Discharge: Decreased caregiver support PT Therapy Diagnosis : Difficulty walking;Acute pain PT Plan PT Frequency: 7X/week PT Treatment/Interventions: DME instruction;Gait training;Stair training;Functional mobility training;Therapeutic activities;Therapeutic exercise;Balance training;Patient/family education PT Recommendation Follow Up Recommendations: Home health PT Equipment Recommended: None recommended by PT PT Goals  Acute Rehab PT Goals PT Goal Formulation: With patient Time For  Goal Achievement: 7 days Pt will go Supine/Side to Sit: with modified independence PT Goal: Supine/Side to Sit - Progress: Not met Pt will go Sit to Supine/Side: with modified independence PT Goal: Sit to Supine/Side - Progress: Not met Pt will go Sit to Stand: with modified independence PT Goal: Sit to Stand - Progress: Not met Pt will go Stand to Sit: with modified independence PT Goal: Stand to Sit - Progress: Not met Pt will Ambulate: >150 feet;with modified independence;with least restrictive assistive device PT Goal: Ambulate - Progress: Not met Pt will Go Up / Down Stairs: 1-2 stairs;with modified independence;with least restrictive assistive device PT Goal: Up/Down Stairs - Progress: Not met Pt will Perform Home Exercise Program: Independently PT Goal: Perform Home Exercise Program - Progress: Not met  PT Evaluation Precautions/Restrictions  Precautions Precautions: Posterior Hip Precaution Booklet Issued: Yes (comment) (Posterior Hip Precautions given on evaluation.) Required Braces or Orthoses: Yes Knee Immobilizer: Other (comment) (Right LE while in bed.) Restrictions Weight Bearing Restrictions: Yes RLE Weight Bearing: Weight bearing as tolerated Prior Functioning  Home Living Lives With: Alone Receives Help From: Family Type of Home: House Home Layout: One level Home Access: Stairs to enter [employer] of Steps: 1 Home Adaptive Equipment: Walker - standard Prior Function Level of Independence: Independent with basic ADLs;Independent with homemaking with ambulation;Independent with gait;Independent with transfers Able to Take Stairs?: Yes Driving: Yes Cognition Cognition Arousal/Alertness: Awake/alert Overall Cognitive Status: Appears within functional limits for tasks assessed Orientation Level: Oriented X4 Sensation/Coordination Sensation Light Touch: Appears Intact Stereognosis: Not tested Hot/Cold: Not tested Proprioception: Not  tested Coordination Gross Motor Movements are Fluid and Coordinated: Yes Fine Motor Movements are Fluid and Coordinated:  Yes Extremity Assessment RUE Assessment RUE Assessment: Not tested LUE Assessment LUE Assessment: Not tested RLE Assessment RLE Assessment: Exceptions to [facility] RLE Strength RLE Overall Strength: Due to pain RLE Overall Strength Comments: 3+/5 LLE Assessment LLE Assessment: Within Functional Limits Pain 2/10 in right hip.  Pt repositioned with ice applied after treatment. Mobility (including Balance) Bed Mobility Bed Mobility: Yes Supine to Sit: 3: Mod assist;With rails;HOB flat Supine to Sit Details (indicate cue type and reason): Assist for right LE and trunk with cues for sequence using bridging. Sitting - Scoot to Edge of Bed: 4: Min assist Sitting - Scoot to [employer] of Bed Details (indicate cue type and reason): Assist for right LE with cues for sequence. Transfers Transfers: Yes Sit to Stand: 4: Min assist;With upper extremity assist;From bed Sit to Stand Details (indicate cue type and reason): Assist to bring trunk over BOS with cues for hand and right LE placement. Stand to Sit: 4: Min assist;With upper extremity assist;To chair/3-in-1 Stand to Sit Details: Assist to slow eccentric descent to chair with cues for hand and right LE placement. Ambulation/Gait Ambulation/Gait: Yes Ambulation/Gait Assistance: 4: Min assist Ambulation/Gait Assistance Details (indicate cue type and reason): Assist for balance with cues for sequence with right LE inside RW. Ambulation Distance (Feet): 30 Feet Assistive device: Rolling walker Gait Pattern: Step-to pattern;Decreased step length - right;Left flexed knee in stance;Trunk flexed Stairs: No Wheelchair Mobility Wheelchair Mobility: No  Posture/Postural Control Posture/Postural Control: No significant limitations Balance Balance Assessed: No Exercise  Total Joint Exercises Ankle Circles/Pumps: AROM;Right;10  reps;Supine Quad Sets: AROM;Right;10 reps;Supine Gluteal Sets: AROM;Both;10 reps;Supine Heel Slides: AAROM;Right;10 reps;Supine End of Session PT - End of Session Equipment Utilized During Treatment: Gait belt Activity Tolerance: Patient tolerated treatment well Patient left: in chair;with call bell in reach Nurse Communication: Mobility status for transfers;Mobility status for ambulation General Behavior During Session: [facility] for tasks performed Cognition: [facility] for tasks performed  Cephus Shelling 11/04/2011, 10:50 AM  11/04/2011 Cephus Shelling, PT, DPT [phone]

## 2011-11-04 NOTE — Progress Notes (Signed)
Physical Therapy Treatment Patient Details Name: Cheyenne Perez MRN: [id number] DOB: February 10, 1946 Today's Date: 11/04/2011  PT Assessment/Plan  PT - Assessment/Plan Comments on Treatment Session: Pt admitted for right THA and tolerating mobility well.  Pt progressing and very motivated. PT Plan: Discharge plan remains appropriate;Frequency remains appropriate PT Frequency: 7X/week Follow Up Recommendations: Home health PT Equipment Recommended: None recommended by PT PT Goals  Acute Rehab PT Goals PT Goal Formulation: With patient Time For Goal Achievement: 7 days PT Goal: Sit to Supine/Side - Progress: Progressing toward goal PT Goal: Sit to Stand - Progress: Progressing toward goal PT Goal: Stand to Sit - Progress: Progressing toward goal PT Goal: Ambulate - Progress: Progressing toward goal PT Goal: Perform Home Exercise Program - Progress: Progressing toward goal  PT Treatment Precautions/Restrictions  Precautions Precautions: Posterior Hip Precaution Booklet Issued: No Required Braces or Orthoses: Yes Knee Immobilizer: Other (comment) (Right LE while in bed.) Restrictions Weight Bearing Restrictions: Yes RLE Weight Bearing: Weight bearing as tolerated Pain 0/10 with treatment. Mobility (including Balance) Bed Mobility Bed Mobility: Yes Supine to Sit: Not tested (comment) Sitting - Scoot to Edge of Bed: Not tested (comment) Sit to Supine: 4: Min assist Sit to Supine - Details (indicate cue type and reason): Assist for right LE to maintain posterior hip precautions.  Cues for sequence. Transfers Transfers: Yes Sit to Stand: 4: Min assist (Min (guard)) Sit to Stand Details (indicate cue type and reason): Guarding for balance with cues for hand and right LE placement. Stand to Sit: 4: Min assist (Min (guard)) Stand to Sit Details: Guarding for balance with cues for hand and right LE placement. Ambulation/Gait Ambulation/Gait: Yes Ambulation/Gait Assistance: 4: Min assist  (Min (guard)) Ambulation/Gait Assistance Details (indicate cue type and reason): Guarding for balance with cues for sequence. Ambulation Distance (Feet): 40 Feet Assistive device: Rolling walker Gait Pattern: Step-to pattern;Decreased step length - right;Left flexed knee in stance;Trunk flexed Stairs: No Wheelchair Mobility Wheelchair Mobility: No  Posture/Postural Control Posture/Postural Control: No significant limitations Balance Balance Assessed: No Exercise  Total Joint Exercises Ankle Circles/Pumps: AROM;Right;10 reps;Supine Quad Sets: AROM;Right;10 reps;Supine Gluteal Sets: AROM;Both;10 reps;Supine Heel Slides: AAROM;Right;10 reps;Supine Hip ABduction/ADduction: AAROM;Right;10 reps;Supine End of Session PT - End of Session Equipment Utilized During Treatment: Gait belt Activity Tolerance: Patient tolerated treatment well Patient left: in bed;with call bell in reach Nurse Communication: Mobility status for transfers;Mobility status for ambulation General Behavior During Session: [facility] for tasks performed Cognition: [facility] for tasks performed  Cephus Shelling 11/04/2011, 2:42 PM  11/04/2011 Cephus Shelling, PT, DPT [phone]

## 2011-11-05 LAB — BASIC METABOLIC PANEL
BUN: 9 mg/dL (ref 6–23)
CO2: 26 mEq/L (ref 19–32)
Calcium: 7.8 mg/dL — ABNORMAL LOW (ref 8.4–10.5)
Chloride: 102 mEq/L (ref 96–112)
Creatinine, Ser: 0.89 mg/dL (ref 0.50–1.10)
GFR calc Af Amer: 78 mL/min — ABNORMAL LOW (ref >90)
GFR calc non Af Amer: 67 mL/min — ABNORMAL LOW (ref >90)
Glucose, Bld: 132 mg/dL — ABNORMAL HIGH (ref 70–99)
Potassium: 3.7 mEq/L (ref 3.5–5.1)
Sodium: 133 mEq/L — ABNORMAL LOW (ref 135–145)

## 2011-11-05 LAB — CBC
HCT: 25 % — ABNORMAL LOW (ref 36.0–46.0)
HCT: 30.5 % — ABNORMAL LOW (ref 36.0–46.0)
Hemoglobin: 8.3 g/dL — ABNORMAL LOW (ref 12.0–15.0)
Hemoglobin: 10.3 g/dL — ABNORMAL LOW (ref 12.0–15.0)
MCH: 29.3 pg (ref 26.0–34.0)
MCH: 29.4 pg (ref 26.0–34.0)
MCHC: 33.2 g/dL (ref 30.0–36.0)
MCHC: 33.8 g/dL (ref 30.0–36.0)
MCV: 88.3 fL (ref 78.0–100.0)
MCV: 87.1 fL (ref 78.0–100.0)
Platelets: 181 10*3/uL (ref 150–400)
Platelets: 196 10*3/uL (ref 150–400)
RBC: 2.83 MIL/uL — ABNORMAL LOW (ref 3.87–5.11)
RBC: 3.5 MIL/uL — ABNORMAL LOW (ref 3.87–5.11)
RDW: 14.5 % (ref 11.5–15.5)
RDW: 14.7 % (ref 11.5–15.5)
WBC: 10.1 10*3/uL (ref 4.0–10.5)
WBC: 11.6 10*3/uL — ABNORMAL HIGH (ref 4.0–10.5)

## 2011-11-05 MED ORDER — WHITE PETROLATUM GEL
Status: AC
  Filled 2011-11-05: qty 5

## 2011-11-05 NOTE — Progress Notes (Signed)
1 unit PRBC's given to pt. V/s stable and documented. No reaction. Wnl. Pt tolerated procedure well. Blood administration education done before transfusion started. All questions answered. Pt satisfied. States she feels pretty good and does not feel as listless and groggy as she did before the transfusion. Resting comfortable in bed post transfusion. Will continue to monitor pt. Laure Kidney [place]

## 2011-11-05 NOTE — Progress Notes (Signed)
Met with patient yesterday to discuss d/c needs. She plans to return home with family. Wants home health out of [place], [place]. States she has been followed by [facility] in the past. Discussed with RNCM- Vance Peper who was aware of patient and will complete d/c.  Social Worker signing off.  Darylene Price, BSW, 11/05/2011 9:54 AM  .

## 2011-11-05 NOTE — Progress Notes (Signed)
Physical Therapy Treatment Patient Details Name: Cheyenne Perez MRN: [id number] DOB: [birth year] Today's Date: 11/05/2011  PT Assessment/Plan  PT - Assessment/Plan Comments on Treatment Session: Pt admitted for right THA and tolerating mobility well.  Pt progressing and very motivated.  Pt due for blood transfusion today due to slightly decreased Hgb. PT Plan: Discharge plan remains appropriate;Frequency remains appropriate PT Frequency: 7X/week Follow Up Recommendations: Home health PT Equipment Recommended: None recommended by PT PT Goals  Acute Rehab PT Goals PT Goal Formulation: With patient Time For Goal Achievement: 7 days PT Goal: Sit to Stand - Progress: Progressing toward goal PT Goal: Stand to Sit - Progress: Progressing toward goal PT Goal: Ambulate - Progress: Progressing toward goal  PT Treatment Precautions/Restrictions  Precautions Precautions: Posterior Hip Precaution Booklet Issued: No Required Braces or Orthoses: Yes Knee Immobilizer: Other (comment) (Right LE while in bed.) Restrictions Weight Bearing Restrictions: Yes RLE Weight Bearing: Weight bearing as tolerated Pain 4/10 in right hip.  Pt repositioned and RN notified. Mobility (including Balance) Bed Mobility Bed Mobility: No Supine to Sit: Not tested (comment) Sitting - Scoot to Edge of Bed: Not tested (comment) Sit to Supine: Not Tested (comment) Transfers Transfers: Yes Sit to Stand: 3: Mod assist;With upper extremity assist;From bed;From chair/3-in-1 Sit to Stand Details (indicate cue type and reason): Assist for balance and due to pain with cues for safest hand and right LE placement. Stand to Sit: 4: Min assist;With upper extremity assist;To chair/3-in-1 Stand to Sit Details: Assist to slow eccentric descent to surface with cues for safest hand and right LE placement. Ambulation/Gait Ambulation/Gait: Yes Ambulation/Gait Assistance: 4: Min assist (Min (guard)) Ambulation/Gait Assistance  Details (indicate cue type and reason): Guarding for balance with cues for extended posture and sequence with RW. Ambulation Distance (Feet): 80 Feet Assistive device: Rolling walker Gait Pattern: Step-to pattern;Decreased step length - right;Left flexed knee in stance;Trunk flexed Stairs: No Wheelchair Mobility Wheelchair Mobility: No  Posture/Postural Control Posture/Postural Control: No significant limitations Balance Balance Assessed: No Exercise  Total Joint Exercises Gluteal Sets: AROM;Both;10 reps;Seated Hip ABduction/ADduction: AROM;Right;10 reps;Standing (Performed hip abd. in small range and add. to neutral.) Long Arc Quad: AROM;Right;10 reps;Seated Standing Hip Extension: AROM;Right;10 reps;Standing General Exercises - Lower Extremity Hip Flexion/Marching: AROM;Right;10 reps;Standing (Performed with knee fully extended into small range.) End of Session PT - End of Session Equipment Utilized During Treatment: Gait belt Activity Tolerance: Patient tolerated treatment well Patient left: in chair;with call bell in reach Nurse Communication: Mobility status for transfers;Mobility status for ambulation General Behavior During Session: [facility] for tasks performed Cognition: [facility] for tasks performed  Cephus Shelling 11/05/2011, 10:17 AM  11/05/2011 Cephus Shelling, PT, DPT [phone]

## 2011-11-05 NOTE — Progress Notes (Signed)
Patient ID: Cheyenne Perez, female   DOB: 04-17-1946, 66 y.o.   MRN: [id number] PATIENT ID:      Cheyenne Perez  MRN:     [id number] DOB/AGE:    08/24/1946 / 66 y.o.    PROGRESS NOTE Subjective:  negative for Chest Pain  negative for Shortness of Breath  negative for Nausea/Vomiting   negative for Calf Pain  negative for Bowel Movement   Tolerating Diet: yes         Patient reports pain as mild.    Objective: Vital signs in last 24 hours:  Patient Vitals for the past 24 hrs:  BP Temp Pulse Resp SpO2  11/05/11 0622 114/44 mmHg 102.1 F (38.9 C) 103  20  95 %  11/04/11 2042 133/61 mmHg 100.2 F (37.9 C) 100  18  -  11/04/11 1451 93/47 mmHg 99.2 F (37.3 C) 87  18  95 %  11/04/11 1257 93/47 mmHg 99 F (37.2 C) 87  18  95 %      Intake/Output from previous day:   01/09 0701 - 01/10 0700 In: 600 [P.O.:600] Out: -    Intake/Output this shift:       Intake/Output      01/09 0701 - 01/10 0700 01/10 0701 - 01/11 0700   P.O. 600    I.V. (mL/kg)     Blood     IV Piggyback     Total Intake(mL/kg) 600 (8.9)    Urine (mL/kg/hr)     Blood     Total Output     Net +600         Urine Occurrence 5 x       LABORATORY DATA:  Basename 11/05/11 0610 11/04/11 1541 11/04/11 0720 11/03/11 1020 10/29/11 1056  WBC 10.1 12.5* 9.6 10.6* 6.0  HGB 8.3* 9.1* 8.7* 10.1* 12.9  HCT 25.0* 27.2* 25.7* 29.6* 39.0  PLT 181 221 213 205 323    Basename 11/05/11 0610 11/04/11 0720 10/29/11 1056  NA 133* 138 142  K 3.7 4.0 4.3  CL 102 106 107  CO2 26 27 27   BUN 9 10 20   CREATININE 0.89 0.80 0.98  GLUCOSE 132* 136* 157*  CALCIUM 7.8* 7.8* 9.9   Lab Results  Component Value Date   INR 1.00 10/29/2011    Examination:  General appearance: alert, cooperative and no distress Resp: clear to auscultation bilaterally Cardio: regular rate and rhythm, S1, S2 normal, no murmur, click, rub or gallop GI: normal findings: bowel sounds normal Extremities: Homans sign is negative, no sign of  DVT  Wound Exam: clean, dry, intact   Drainage:  None: wound tissue dry  Motor Exam EHL, FHL, Anterior Tibial and Posterior Tibial Intact  Sensory Exam Superficial Peroneal, Deep Peroneal and Tibial normal  Assessment:    2 Days Post-Op  Procedure(s) (LRB): TOTAL HIP ARTHROPLASTY (Right)  ADDITIONAL DIAGNOSIS:  Principal Problem:  *Osteoarthritis of hip Active Problems:  Restless leg syndrome  Postoperative anemia due to acute blood loss  Acute Blood Loss Anemia and Hyponatremia   Plan: Physical Therapy as ordered Weight Bearing as Tolerated (WBAT)  DVT Prophylaxis:  Xarelto  DISCHARGE PLAN: Home  DISCHARGE NEEDS: HHPT, Walker and 3-in-1 comode seat         Tareek Sabo W 11/05/2011, 10:44 AM

## 2011-11-06 ENCOUNTER — Inpatient Hospital Stay (HOSPITAL_COMMUNITY): Payer: BC Managed Care – PPO

## 2011-11-06 DIAGNOSIS — E871 Hypo-osmolality and hyponatremia: Secondary | ICD-10-CM | POA: Diagnosis not present

## 2011-11-06 LAB — BASIC METABOLIC PANEL
BUN: 8 mg/dL (ref 6–23)
CO2: 26 mEq/L (ref 19–32)
Calcium: 8.2 mg/dL — ABNORMAL LOW (ref 8.4–10.5)
Chloride: 104 mEq/L (ref 96–112)
Creatinine, Ser: 0.78 mg/dL (ref 0.50–1.10)
GFR calc Af Amer: >90 mL/min (ref >90)
GFR calc non Af Amer: 87 mL/min — ABNORMAL LOW (ref >90)
Glucose, Bld: 125 mg/dL — ABNORMAL HIGH (ref 70–99)
Potassium: 3.7 mEq/L (ref 3.5–5.1)
Sodium: 136 mEq/L (ref 135–145)

## 2011-11-06 LAB — URINALYSIS, ROUTINE W REFLEX MICROSCOPIC
Bilirubin Urine: NEGATIVE
Glucose, UA: NEGATIVE mg/dL
Hgb urine dipstick: NEGATIVE
Ketones, ur: NEGATIVE mg/dL
Leukocytes, UA: NEGATIVE
Nitrite: NEGATIVE
Protein, ur: NEGATIVE mg/dL
Specific Gravity, Urine: 1.007 (ref 1.005–1.030)
Urobilinogen, UA: 0.2 mg/dL (ref 0.0–1.0)
pH: 7 (ref 5.0–8.0)

## 2011-11-06 LAB — CBC
HCT: 27.3 % — ABNORMAL LOW (ref 36.0–46.0)
Hemoglobin: 9.1 g/dL — ABNORMAL LOW (ref 12.0–15.0)
MCH: 29 pg (ref 26.0–34.0)
MCHC: 33.3 g/dL (ref 30.0–36.0)
MCV: 86.9 fL (ref 78.0–100.0)
Platelets: 195 10*3/uL (ref 150–400)
RBC: 3.14 MIL/uL — ABNORMAL LOW (ref 3.87–5.11)
RDW: 14.5 % (ref 11.5–15.5)
WBC: 8.8 10*3/uL (ref 4.0–10.5)

## 2011-11-06 MED ORDER — RIVAROXABAN 10 MG PO TABS: 10.0000 mg | ORAL_TABLET | Freq: Every day | ORAL | Status: DC

## 2011-11-06 MED ORDER — OXYCODONE HCL 5 MG PO TABS: 5.0000 mg | ORAL_TABLET | ORAL | Status: AC | PRN

## 2011-11-06 MED ORDER — ACETAMINOPHEN 500 MG PO TABS: 1000.0000 mg | ORAL_TABLET | Freq: Four times a day (QID) | ORAL | Status: DC | PRN

## 2011-11-06 MED ORDER — METHOCARBAMOL 500 MG PO TABS: 500.0000 mg | ORAL_TABLET | Freq: Four times a day (QID) | ORAL | Status: AC | PRN

## 2011-11-06 MED ORDER — BISACODYL 10 MG RE SUPP
10.0000 mg | Freq: Once | RECTAL | Status: AC
  Administered 2011-11-06: 10 mg via RECTAL
  Filled 2011-11-06: qty 1

## 2011-11-06 NOTE — Discharge Summary (Signed)
PATIENT ID:      Cheyenne Perez  MRN:     [id number] DOB/AGE:    [birth year] / 66 y.o.     DISCHARGE SUMMARY  ADMISSION DATE:    11/03/2011 DISCHARGE DATE:   11/06/2011   ADMISSION DIAGNOSIS: osteoarthritis right hip  (osteoarthritis right hip)  DISCHARGE DIAGNOSIS:  osteoarthritis right hip    ADDITIONAL DIAGNOSIS: Principal Problem:  *Osteoarthritis of hip RIGHT Active Problems:  Restless leg syndrome  Postoperative anemia due to acute blood loss  Post op hyponatremia - resolved  Past Medical History  Diagnosis Date  . Diabetes mellitus     no meds yet, told that she has borderline concern  . Neuromuscular disorder     neuropathy- R side pain- hip- down leg  . Arthritis     osteo- hip-R  . Restless leg     PROCEDURE: Procedure(s): TOTAL HIP ARTHROPLASTY on 11/03/2011  CONSULTS:  NONE   HISTORY: Cheyenne Perez is a very pleasant 66 year old white female who is seen today for evaluation of her right hip. She's had a previous history of a left total hip replacement back in 2009 by an [facility] physician, Dr. Arletha Grippe. She did not have much in the way of any problems. However, she has noted over the last 6-8 months she's been having increasing pain and discomfort in the right hip and groin area. She is now having problems with sleeping at nighttime as well as activities of daily living. Her pain is now constant and quite severe, sharp and aching quality. It is exacerbated by activity and decreased by rest. She is having difficulty finding even any position that will make her comfortable. She does use crutches intermittently also.   HOSPITAL COURSE:  Cheyenne Perez is a 66 y.o. admitted on 11/03/2011 and found to have a diagnosis of osteoarthritis right hip.  After appropriate laboratory studies were obtained  they were taken to the operating room on 11/03/2011 and underwent Procedure(s): TOTAL HIP ARTHROPLASTY.   They were given perioperative antibiotics:  Anti-infectives     Start      Dose/Rate Route Frequency Ordered Stop   11/03/11 1400   ceFAZolin (ANCEF) IVPB 1 g/50 mL premix        1 g 100 mL/hr over 30 Minutes Intravenous Every 6 hours 11/03/11 1321 11/04/11 0314   11/02/11 1445   ceFAZolin (ANCEF) IVPB 1 g/50 mL premix        1 g 100 mL/hr over 30 Minutes Intravenous 60 min pre-op 11/02/11 1442 11/03/11 0735        . Blood products given: 3 UNITS PRBC  After appropriate laboratory studies were obtained, the patient was taken to the operating room where they underwent a Right Total Hip Replacement.  Tolerated the procedure well.  Placed with a foley intraoperatively.  Given Ofirmev at induction and for 48 hours.  PCA for analgesia. 1 Unit PRBC's intraop.  POD #1, allowed out of bed to a chair.  PT for ambulation and exercise program.  Foley D/C'd in morning.  IV saline locked.  O2 discontionued. 1 unit PRBC's given  POD #2, continued PT and ambulation. 1 unit PRBC's given  The remainder of the hospital course was dedicated to ambulation and strengthening.   The patient was discharged on 3 Days Post-Op in  Stable condition.   DIAGNOSTIC STUDIES: Recent vital signs: Patient Vitals for the past 24 hrs:  BP Temp Temp src Pulse Resp SpO2  11/06/11 0628 - 100.3 F (37.9 C) - - - -  11/06/11 0549 108/63 mmHg 101.6 F (38.7 C) - 97  18  93 %  12-05-11 2059 113/46 mmHg 98.1 F (36.7 C) - 92  18  97 %  12-05-11 1445 113/80 mmHg 98.8 F (37.1 C) Oral 96  18  -  12-05-11 1415 109/72 mmHg 98.8 F (37.1 C) Oral 101  18  -  12-05-11 1345 108/72 mmHg 98.7 F (37.1 C) Oral 101  18  -  12-05-2011 1245 116/49 mmHg 98 F (36.7 C) Oral 89  18  -  2011-12-05 1145 105/44 mmHg 97.8 F (36.6 C) Oral 89  18  -  2011/12/05 1130 99/39 mmHg 97.8 F (36.6 C) Oral 86  18  -  05-Dec-2011 1115 101/45 mmHg 99 F (37.2 C) Oral 84  18  -  2011-12-05 1100 92/43 mmHg 98.6 F (37 C) Oral 81  18  -  05-Dec-2011 1045 94/36 mmHg 99 F (37.2 C) Oral 83  18  -       Recent laboratory  studies:  Basename 11/06/11 0555 December 05, 2011 1659 2011-12-05 0610 11/04/11 1541 11/04/11 0720 11/03/11 1020  WBC 8.8 11.6* 10.1 12.5* 9.6 10.6*  HGB 9.1* 10.3* 8.3* 9.1* 8.7* 10.1*  HCT 27.3* 30.5* 25.0* 27.2* 25.7* 29.6*  PLT 195 196 181 221 213 205    Basename 11/06/11 0555 Dec 05, 2011 0610 11/04/11 0720  NA 136 133* 138  K 3.7 3.7 4.0  CL 104 102 106  CO2 26 26 27   BUN 8 9 10   CREATININE 0.78 0.89 0.80  GLUCOSE 125* 132* 136*  CALCIUM 8.2* 7.8* 7.8*   Lab Results  Component Value Date   INR 1.00 10/29/2011     Recent Radiographic Studies :   Chest 2 View  10/29/2011  *RADIOLOGY REPORT*  Clinical Data: Preoperative respiratory exam; right hip replacement  CHEST - 2 VIEW  Comparison: None.  Findings: The cardiac silhouette, mediastinum, pulmonary vasculature are within normal limits.  Both lungs are clear. There is no acute bony abnormality.  IMPRESSION: There is no evidence of acute cardiac or pulmonary process.  Original Report Authenticated By: Brandon Melnick, M.D.   Dg Pelvis Portable  11/03/2011  *RADIOLOGY REPORT*  Clinical Data: Right total hip replacement.  PORTABLE PELVIS  Comparison: 11/03/2011  Findings: New total right hip replacement.  Normal alignment.  No hardware or bony complicating feature.  Remote changes of left hip replacement.  No acute bony abnormality.  IMPRESSION: Right hip replacement without complicating feature.  Original Report Authenticated By: Cyndie Chime, M.D.   Dg Pelvis Portable  11/03/2011  *RADIOLOGY REPORT*  Clinical Data: Preop evaluation.  PORTABLE PELVIS  Comparison: None.  Findings: Changes of left hip replacement noted.  Advanced arthritic changes in the right hip with near complete joint space loss, subchondral sclerosis and osteophyte formation.  Large subchondral cyst noted within the superior acetabulum. No acute bony abnormality.  Specifically, no fracture, subluxation, or dislocation.  Soft tissues are intact.  IMPRESSION: Advanced arthritic  changes in the right hip.  Prior left hip replacement.  Original Report Authenticated By: Cyndie Chime, M.D.   Dg Hip Portable 1 View Right  11/03/2011  *RADIOLOGY REPORT*  Clinical Data: Postop right hip replacement.  PORTABLE RIGHT HIP - 1 VIEW  Comparison: Pelvis image today.  Findings: The patient is status post new right hip replacement. Normal alignment.  No hardware or bony complicating feature. Remote left hip replacement.  IMPRESSION: Right hip replacement without complicating feature.  Original Report Authenticated By: Caryn Bee  G. Kearney Hard, M.D.    DISCHARGE INSTRUCTIONS: Discharge Orders    Future Orders Please Complete By Expires   Diet general      Call MD / Call 911      Comments:   If you experience chest pain or shortness of breath, CALL 911 and be transported to the hospital emergency room.  If you develope a fever above 101 F, pus (white drainage) or increased drainage or redness at the wound, or calf pain, call your surgeon's office.   Constipation Prevention      Comments:   Drink plenty of fluids.  Prune juice may be helpful.  You may use a stool softener, such as Colace (over the counter) 100 mg twice a day.  Use MiraLax (over the counter) for constipation as needed.   Increase activity slowly as tolerated      Weight Bearing as taught in Physical Therapy      Comments:   Use a walker or crutches as instructed in Physical Therapy (PT)   Patient may shower      Comments:   You may shower without a dressing once there is no drainage.  Do not wash over the wound.  If drainage remains, cover wound with plastic wrap and then shower.   Driving restrictions      Comments:   No driving for 6 weeks   Lifting restrictions      Comments:   No lifting for 6 weeks   Discharge instructions      Comments:   Diet: As you were doing prior to hospitalization   Activity:  Increase activity slowly as tolerated                  No lifting or driving for 6 weeks  Shower:  May shower  without a dressing once there is no drainage from your wound.                 Do NOT wash over the wound.  If drainage remains, cover wound with saran                  Wrap and then shower.  Clean incision with betadine and change dressing   Dressing:  You may change your dressing on Sunday                    Then change the dressing daily with sterile 4"x4"s gauze dressing                     And TED hose for knees.  Use paper tape to hold dressing in place                     For hips.  You may clean the incision with alcohol prior to redressing.  Weight Bearing:  Weight bearing as tolerated as taught in physical therapy.  Use a walker or                    Crutches as instructed.  Follow- Up Appointment:  Please call for an appointment to be seen 2 weeks from surgery                             [place] - [phone]    Follow the hip precautions as taught in Physical Therapy      Change dressing  Comments:   You may change your dressing on Sunday, then change the dressing daily with sterile 4 x 4 inch gauze dressing and paper tape.  You may clean the incision with alcohol prior to redressing   TED hose      Comments:   Use stockings (TED hose) for 3 weeks on Right leg(s).  You may remove them at night for sleeping.      DISCHARGE MEDICATIONS:  Current Discharge Medication List    START taking these medications   Details  methocarbamol (ROBAXIN) 500 MG tablet Take 1 tablet (500 mg total) by mouth every 6 (six) hours as needed. Qty: 40 tablet, Refills: 0    oxyCODONE (OXY IR/ROXICODONE) 5 MG immediate release tablet Take 1-2 tablets (5-10 mg total) by mouth every 4 (four) hours as needed for pain. Qty: 90 tablet, Refills: 0    rivaroxaban (XARELTO) 10 MG TABS tablet Take 1 tablet (10 mg total) by mouth daily. TAKE AT 10PM Qty: 30 tablet, Refills: 0      CONTINUE these medications which have NOT CHANGED   Details  Calcium Carbonate-Vitamin D (CALTRATE 600+D) 600-400  MG-UNIT per tablet Take 1 tablet by mouth 2 (two) times daily.      gabapentin (NEURONTIN) 300 MG capsule Take 300 mg by mouth at bedtime as needed. For pain     rOPINIRole (REQUIP) 1 MG tablet Take 1 mg by mouth at bedtime.        STOP taking these medications     Aspirin-Salicylamide-Caffeine (BC HEADACHE POWDER PO)      diclofenac-misoprostol (ARTHROTEC 75) 75-200 MG-MCG per tablet      Multiple Vitamin (MULITIVITAMIN WITH MINERALS) TABS      naproxen sodium (ANAPROX) 220 MG tablet      OVER THE COUNTER MEDICATION      OVER THE COUNTER MEDICATION         FOLLOW UP VISIT:   Follow-up Information    Follow up with Valeria Batman, MD on 11/18/2011.   Contact information:   [facility]          DISPOSITION:  Home   CONDITION:  Stable   PETRARCA,BRIAN 11/06/2011, 8:34 AM

## 2011-11-06 NOTE — Progress Notes (Signed)
Patient ID: Cheyenne Perez, female   DOB: 1945-12-16, 66 y.o.   MRN: [id number] PATIENT ID:      Cheyenne Perez  MRN:     [id number] DOB/AGE:    1946-10-11 / 66 y.o.    PROGRESS NOTE Subjective:  negative for Chest Pain  negative for Shortness of Breath  negative for Nausea/Vomiting   negative for Calf Pain  negative for Bowel Movement   Tolerating Diet: yes         Patient reports pain as 3 on 0-10 scale.    Objective: Vital signs in last 24 hours:  Patient Vitals for the past 24 hrs:  BP Temp Temp src Pulse Resp SpO2  11/06/11 0628 - 100.3 F (37.9 C) - - - -  11/06/11 0549 108/63 mmHg 101.6 F (38.7 C) - 97  18  93 %  11/05/11 2059 113/46 mmHg 98.1 F (36.7 C) - 92  18  97 %  11/05/11 1445 113/80 mmHg 98.8 F (37.1 C) Oral 96  18  -  11/05/11 1415 109/72 mmHg 98.8 F (37.1 C) Oral 101  18  -  11/05/11 1345 108/72 mmHg 98.7 F (37.1 C) Oral 101  18  -  11/05/11 1245 116/49 mmHg 98 F (36.7 C) Oral 89  18  -  11/05/11 1145 105/44 mmHg 97.8 F (36.6 C) Oral 89  18  -  11/05/11 1130 99/39 mmHg 97.8 F (36.6 C) Oral 86  18  -  11/05/11 1115 101/45 mmHg 99 F (37.2 C) Oral 84  18  -  11/05/11 1100 92/43 mmHg 98.6 F (37 C) Oral 81  18  -  11/05/11 1045 94/36 mmHg 99 F (37.2 C) Oral 83  18  -      Intake/Output from previous day:   01/10 0701 - 01/11 0700 In: 1762.5 [P.O.:900; I.V.:500] Out: -    Intake/Output this shift:       Intake/Output      01/10 0701 - 01/11 0700 01/11 0701 - 01/12 0700   P.O. 900    I.V. (mL/kg) 500 (7.4)    Blood 362.5    Total Intake(mL/kg) 1762.5 (26.1)    Net +1762.5         Urine Occurrence 3 x       LABORATORY DATA:  Basename 11/06/11 0555 11/05/11 1659 11/05/11 0610 11/04/11 1541 11/04/11 0720 11/03/11 1020  WBC 8.8 11.6* 10.1 12.5* 9.6 10.6*  HGB 9.1* 10.3* 8.3* 9.1* 8.7* 10.1*  HCT 27.3* 30.5* 25.0* 27.2* 25.7* 29.6*  PLT 195 196 181 221 213 205    Basename 11/06/11 0555 11/05/11 0610 11/04/11 0720  NA 136 133* 138    K 3.7 3.7 4.0  CL 104 102 106  CO2 26 26 27   BUN 8 9 10   CREATININE 0.78 0.89 0.80  GLUCOSE 125* 132* 136*  CALCIUM 8.2* 7.8* 7.8*   Lab Results  Component Value Date   INR 1.00 10/29/2011    Examination:  General appearance: alert and mild distress Resp: clear to auscultation bilaterally Cardio: regular rate and rhythm GI: normal findings: bowel sounds normal Extremities: Homans sign is negative, no sign of DVT  Wound Exam: clean, dry, intact   Drainage:  Scant/small amount Serous exudate  Motor Exam EHL, FHL, Anterior Tibial and Posterior Tibial Intact  Sensory Exam Superficial Peroneal, Deep Peroneal and Tibial normal  Assessment:    3 Days Post-Op  Procedure(s) (LRB): TOTAL HIP ARTHROPLASTY (Right)  ADDITIONAL DIAGNOSIS:  Principal Problem:  *  Osteoarthritis of hip Active Problems:  Restless leg syndrome  Postoperative anemia due to acute blood loss  Acute Blood Loss Anemia   Plan: Physical Therapy as ordered Weight Bearing as Tolerated (WBAT)  DVT Prophylaxis:  Xarelto, Foot Pumps and TED hose  DISCHARGE PLAN: Home  DISCHARGE NEEDS: HHPT, Walker and 3-in-1 comode seat  WILL OBTAIN CXR AND U/A FOR ELEVATED TEMP WILL TRY TO OBTAIN A BOWEL MOVEMENT IF STUDIES ARE ACCEPTABLE, WILL D/C THIS AFTERNOON        PETRARCA,BRIAN 11/06/2011, 8:07 AM

## 2011-11-06 NOTE — Progress Notes (Signed)
Occupational Therapy Treatment Patient Details Name: Cheyenne Perez MRN: [id number] DOB: 1946/08/06 Today's Date: 11/06/2011  OT Assessment/Plan OT Assessment/Plan OT Plan: Discharge plan remains appropriate OT Frequency: Min 2X/week Follow Up Recommendations: Home health OT;Supervision - Intermittent Equipment Recommended: None recommended by PT OT Goals Acute Rehab OT Goals OT Goal Formulation: With patient Time For Goal Achievement: 2 weeks ADL Goals Pt Will Perform Grooming: with modified independence;Standing at sink ADL Goal: Grooming - Progress: Not met Pt Will Perform Lower Body Bathing: with supervision;with set-up;Sit to stand from bed ADL Goal: Lower Body Bathing - Progress: Partly met Pt Will Perform Lower Body Dressing: with set-up;with supervision;with adaptive equipment;Sit to stand from bed ADL Goal: Lower Body Dressing - Progress: Not met Pt Will Transfer to Toilet: with modified independence;Ambulation;with DME;3-in-1 ADL Goal: Toilet Transfer - Progress: Progressing toward goals Pt Will Perform Toileting - Hygiene: with modified independence;Standing at 3-in-1/toilet ADL Goal: Toileting - Hygiene - Progress: Progressing toward goals  OT Treatment Precautions/Restrictions  Precautions Precautions: Posterior Hip Precaution Booklet Issued: No Required Braces or Orthoses: Yes Knee Immobilizer: Other (comment) Restrictions Weight Bearing Restrictions: Yes RLE Weight Bearing: Weight bearing as tolerated   ADL ADL ADL Comments: Pt. educated on use of walker back and use of velcro to attach reacher to walker to prevent bending to ground during ambulation at home. Pt. verbalized technique and use of AE for LB ADLs at home. Discussed technique for IADL completion with use of RW to ensure pt. safety at D/C home. End of Session OT - End of Session Activity Tolerance: Patient tolerated treatment well Patient left: in bed;with call bell in reach General Behavior  During Session: [facility] for tasks performed Cognition: [facility] for tasks performed  Cassandria Anger, OTR/L Pager [phone]  11/06/2011, 2:38 PM

## 2011-11-06 NOTE — Progress Notes (Signed)
Physical Therapy Treatment Patient Details Name: Cheyenne Perez MRN: [id number] DOB: [birth year] Today's Date: 11/06/2011  PT Assessment/Plan  PT - Assessment/Plan Comments on Treatment Session: Pt admitted for right THA and tolerating mobility well.  Pt progressing and very motivated.  Pt ready for d/c home once medically stable and will have assistance from brother/nephew. PT Plan: Discharge plan remains appropriate;Frequency remains appropriate PT Frequency: 7X/week Follow Up Recommendations: Home health PT Equipment Recommended: None recommended by PT PT Goals  Acute Rehab PT Goals PT Goal Formulation: With patient Time For Goal Achievement: 7 days PT Goal: Supine/Side to Sit - Progress: Progressing toward goal PT Goal: Sit to Stand - Progress: Progressing toward goal PT Goal: Stand to Sit - Progress: Progressing toward goal PT Goal: Ambulate - Progress: Progressing toward goal PT Goal: Up/Down Stairs - Progress: Progressing toward goal PT Goal: Perform Home Exercise Program - Progress: Progressing toward goal  PT Treatment Precautions/Restrictions  Precautions Precautions: Posterior Hip Precaution Booklet Issued: No Required Braces or Orthoses: Yes Knee Immobilizer: Other (comment) (Right LE while in bed.) Restrictions Weight Bearing Restrictions: Yes RLE Weight Bearing: Weight bearing as tolerated Pain 4/10 in right hip.  Pt repositioned. Mobility (including Balance) Bed Mobility Bed Mobility: Yes Supine to Sit: 5: Supervision;HOB flat Supine to Sit Details (indicate cue type and reason): Verbal cues to maintain posterior hip precautions with increased time secondary to pain. Sitting - Scoot to Edge of Bed: 6: Modified independent (Device/Increase time) Sit to Supine: Not Tested (comment) Transfers Transfers: Yes Sit to Stand: 5: Supervision Sit to Stand Details (indicate cue type and reason): Verbal cues for safest hand placement and right LE placement. Stand to  Sit: 5: Supervision Stand to Sit Details: Verbal cues to slow descent and for right LE and hand placement. Ambulation/Gait Ambulation/Gait: Yes Ambulation/Gait Assistance: 5: Supervision Ambulation/Gait Assistance Details (indicate cue type and reason): Verbal cues for initial contact with right heel to normalize step through sequence. Ambulation Distance (Feet): 150 Feet Assistive device: Rolling walker Gait Pattern: Step-through pattern;Decreased step length - right;Decreased stance time - right Stairs: Yes Stairs Assistance: 4: Min assist (Min (guard)) Stairs Assistance Details (indicate cue type and reason): Guarding for balance with cues for sequence backwards. Stair Management Technique: Backwards;Step to pattern;With walker Number of Stairs: 1  Height of Stairs: 8  (8 inches) Wheelchair Mobility Wheelchair Mobility: No  Posture/Postural Control Posture/Postural Control: No significant limitations Balance Balance Assessed: No Exercise  Total Joint Exercises Long Arc Quad: AROM;Right;10 reps;Seated End of Session PT - End of Session Equipment Utilized During Treatment: Gait belt Activity Tolerance: Patient tolerated treatment well Patient left: in chair;with call bell in reach Nurse Communication: Mobility status for transfers;Mobility status for ambulation General Behavior During Session: [facility] for tasks performed Cognition: [facility] for tasks performed  Cephus Shelling 11/06/2011, 11:48 AM  11/06/2011 Cephus Shelling, PT, DPT [phone]

## 2011-11-07 NOTE — Progress Notes (Addendum)
Procedure(s) (LRB): TOTAL HIP ARTHROPLASTY (Right) 4 Days Post-Op   Subjective:  She feels ready to go home  Objective:   VITALS:  BP 129/76  Pulse 78  Temp(Src) 99.1 F (37.3 C) (Oral)  Resp 18  Ht 5\' 5"  (1.651 m)  Wt 67.501 kg (148 lb 13 oz)  BMI 24.76 kg/m2  SpO2 97%  Dressings are clean. EHL and FHL are firing. There is no drainage.  LABS Lab Results  Component Value Date   HGB 9.1* 11/06/2011   HGB 10.3* 11/05/2011   HGB 8.3* 11/05/2011   Lab Results  Component Value Date   WBC 8.8 11/06/2011   PLT 195 11/06/2011   Lab Results  Component Value Date   INR 1.00 10/29/2011   Lab Results  Component Value Date   NA 136 11/06/2011   K 3.7 11/06/2011   CL 104 11/06/2011   CO2 26 11/06/2011   BUN 8 11/06/2011   CREATININE 0.78 11/06/2011   GLUCOSE 125* 11/06/2011   Dg Chest 2 View  11/06/2011  *RADIOLOGY REPORT*  Clinical Data: Postoperative fever.  Status post hip replacement.  CHEST - 2 VIEW  Comparison: PA and lateral chest 10/29/2011.  Findings: There is minimal subsegmental atelectasis in the bases. No consolidative process.  No pneumothorax or effusion.  Heart size normal.  IMPRESSION: Mild basilar atelectasis.  Original Report Authenticated By: Bernadene Bell. Maricela Curet, M.D.     Assessment/Plan: Principal Problem:  *Osteoarthritis of hip Active Problems:  Restless leg syndrome  Postoperative anemia due to acute blood loss   Discharge home today.  U/a neg, now afeb, cxr with atelectasis.  IS taught.   Giuliano Preece P 11/07/2011, 9:41 AM

## 2011-11-07 NOTE — Progress Notes (Signed)
Physical Therapy Treatment Patient Details Name: BOBBIJO HOLST MRN: [id number] DOB: [birth year] Today's Date: 11/07/2011  PT Assessment/Plan  PT - Assessment/Plan PT Plan: Discharge plan remains appropriate PT Frequency: 7X/week Follow Up Recommendations: Home health PT PT Goals  Acute Rehab PT Goals PT Goal: Sit to Stand - Progress: Met PT Goal: Stand to Sit - Progress: Met PT Goal: Ambulate - Progress: Progressing toward goal PT Goal: Perform Home Exercise Program - Progress: Progressing toward goal  PT Treatment Precautions/Restrictions  Precautions Precautions: Posterior Hip Precaution Booklet Issued: No Required Braces or Orthoses: Yes Knee Immobilizer: Other (comment) Restrictions Weight Bearing Restrictions: Yes RLE Weight Bearing: Weight bearing as tolerated Mobility (including Balance) Transfers Sit to Stand: 6: Modified independent (Device/Increase time) Stand to Sit: 6: Modified independent (Device/Increase time) Ambulation/Gait Ambulation/Gait: Yes Ambulation/Gait Assistance: 5: Supervision Ambulation/Gait Assistance Details (indicate cue type and reason): Cues to increase weight through LEs Ambulation Distance (Feet): 160 Feet Assistive device: Rolling walker Gait Pattern: Step-through pattern;Decreased stride length Stairs: No    Exercise  Total Joint Exercises Heel Slides: AAROM;Right;10 reps Long Arc Quad: AAROM;Right;10 reps End of Session PT - End of Session Equipment Utilized During Treatment: Gait belt Activity Tolerance: Patient tolerated treatment well Patient left: in chair;with family/visitor present Nurse Communication: Mobility status for transfers;Mobility status for ambulation General Behavior During Session: [facility] for tasks performed Cognition: [facility] for tasks performed  Kree Rafter, Adline Potter 11/07/2011, 12:52 PM

## 2012-10-27 IMAGING — CR DG PORTABLE PELVIS
1 series · 1 of 1 positions shown · non-contrast
Comparison: None.

CLINICAL DATA: Preop evaluation.

PORTABLE PELVIS

[view not recorded]
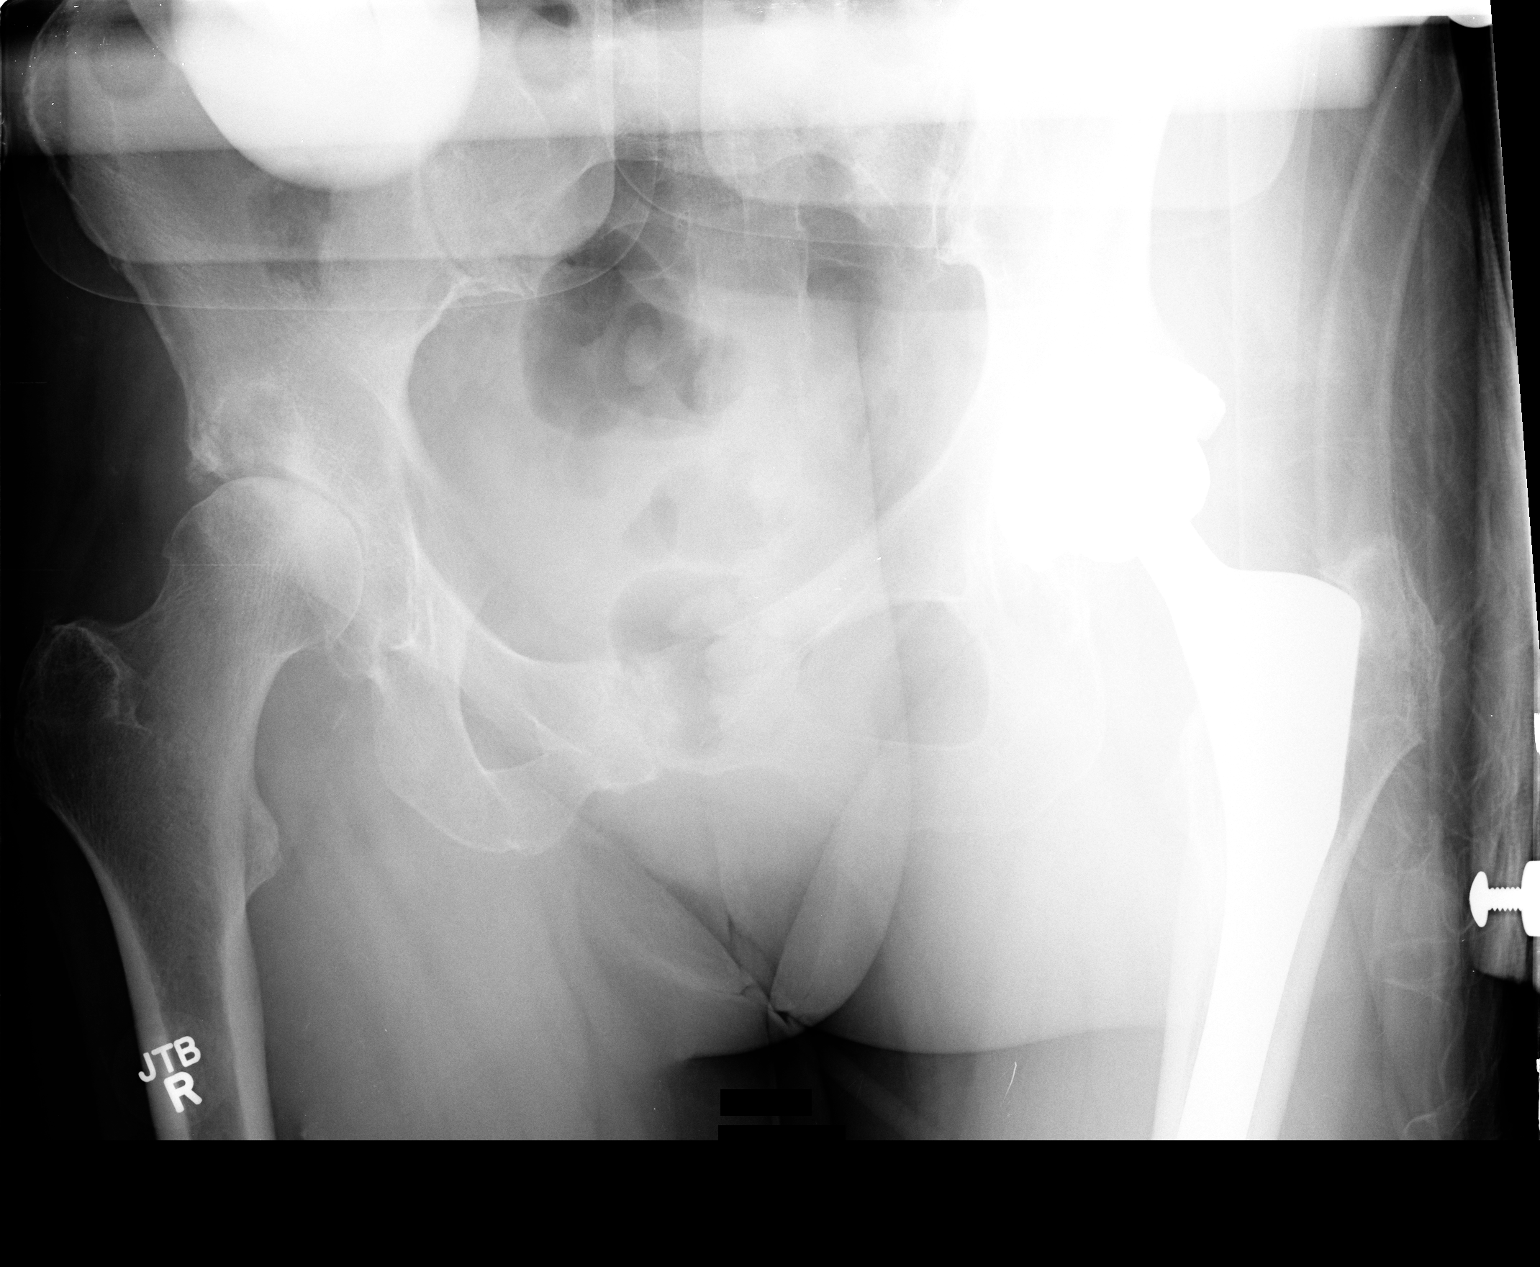

[1 of 1 positions shown; findings below may reference images not displayed]

FINDINGS: Changes of left hip replacement noted.  Advanced
arthritic changes in the right hip with near complete joint space
loss, subchondral sclerosis and osteophyte formation.  Large
subchondral cyst noted within the superior acetabulum. No acute
bony abnormality.  Specifically, no fracture, subluxation, or
dislocation.  Soft tissues are intact.
IMPRESSION: Advanced arthritic changes in the right hip.  Prior left hip
replacement.

## 2012-10-27 IMAGING — CR DG PORTABLE PELVIS
1 series · 1 of 1 positions shown · non-contrast
Comparison: 11/03/2011

CLINICAL DATA: Right total hip replacement.

PORTABLE PELVIS

[AP]
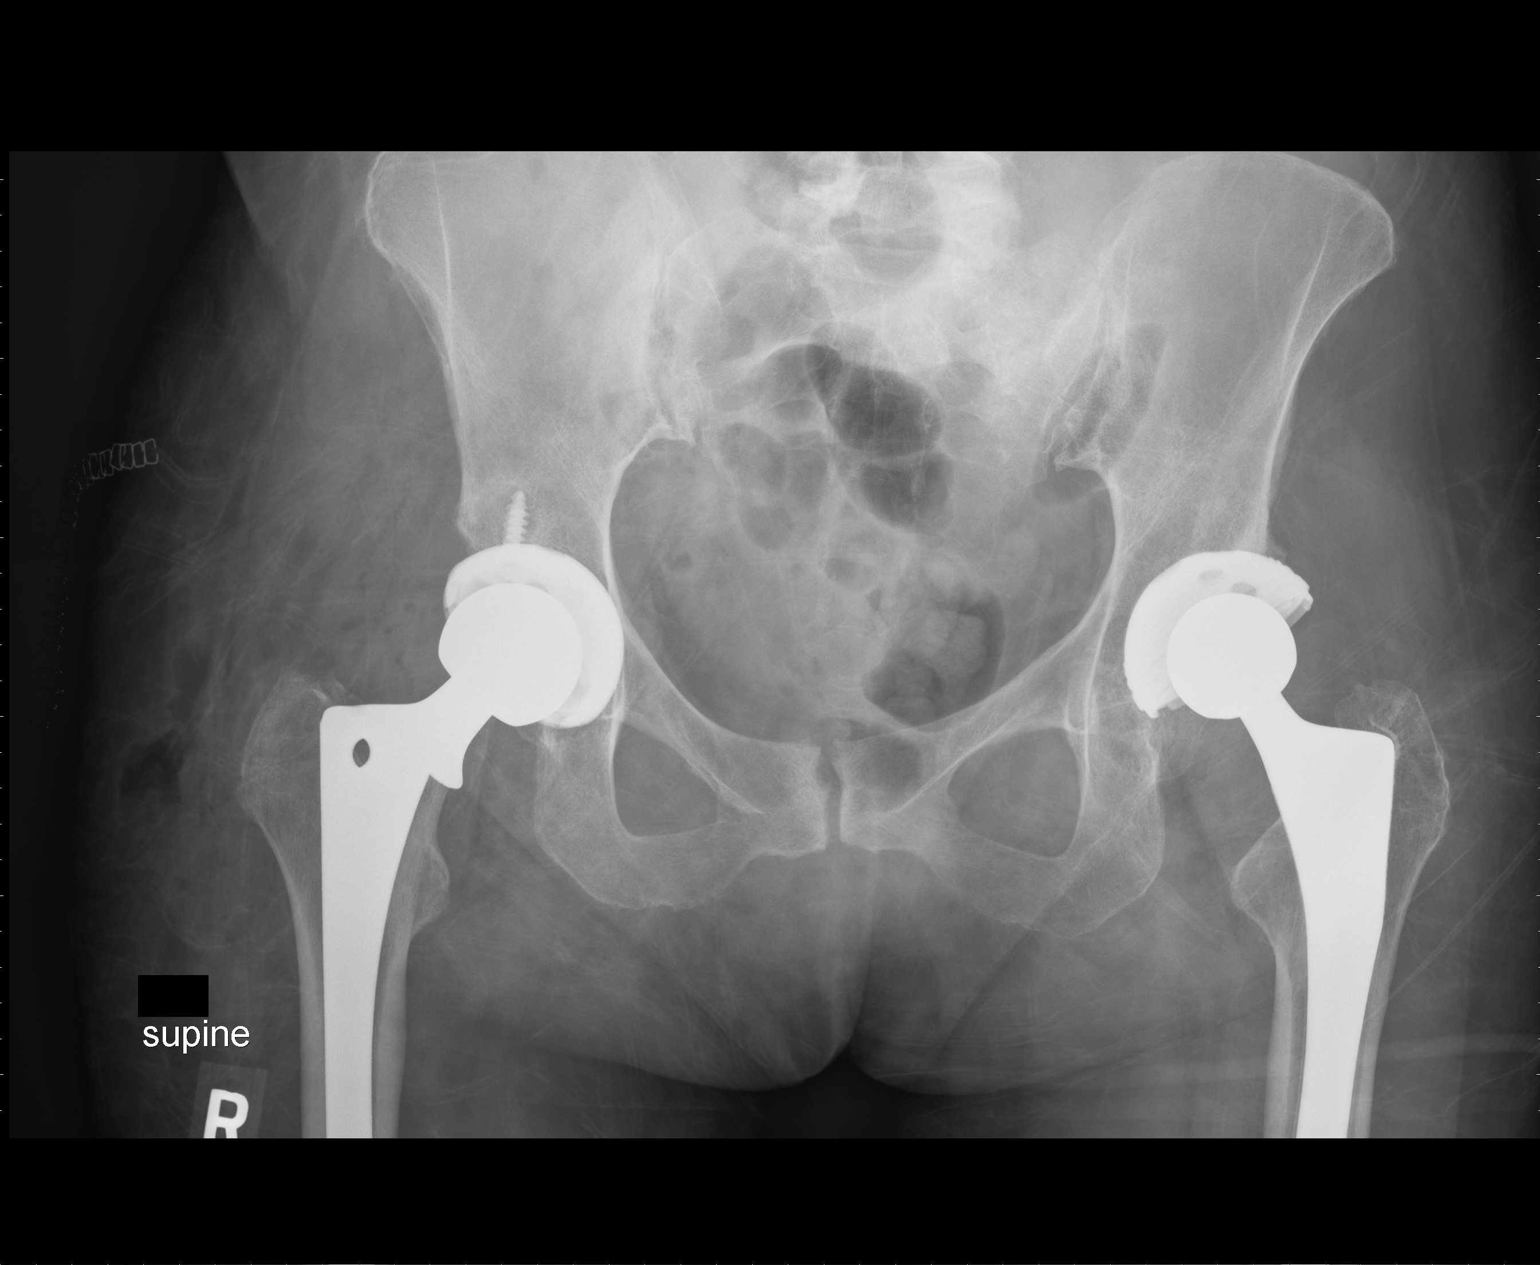

[1 of 1 positions shown; findings below may reference images not displayed]

FINDINGS: New total right hip replacement.  Normal alignment.  No
hardware or bony complicating feature.  Remote changes of left hip
replacement.  No acute bony abnormality.
IMPRESSION: Right hip replacement without complicating feature.

## 2012-10-30 IMAGING — CR DG CHEST 2V
2 series · 2 of 2 positions shown · non-contrast
Comparison: PA and lateral chest 10/29/2011.

CLINICAL DATA: Postoperative fever.  Status post hip replacement.

CHEST - 2 VIEW

[w chest pa]
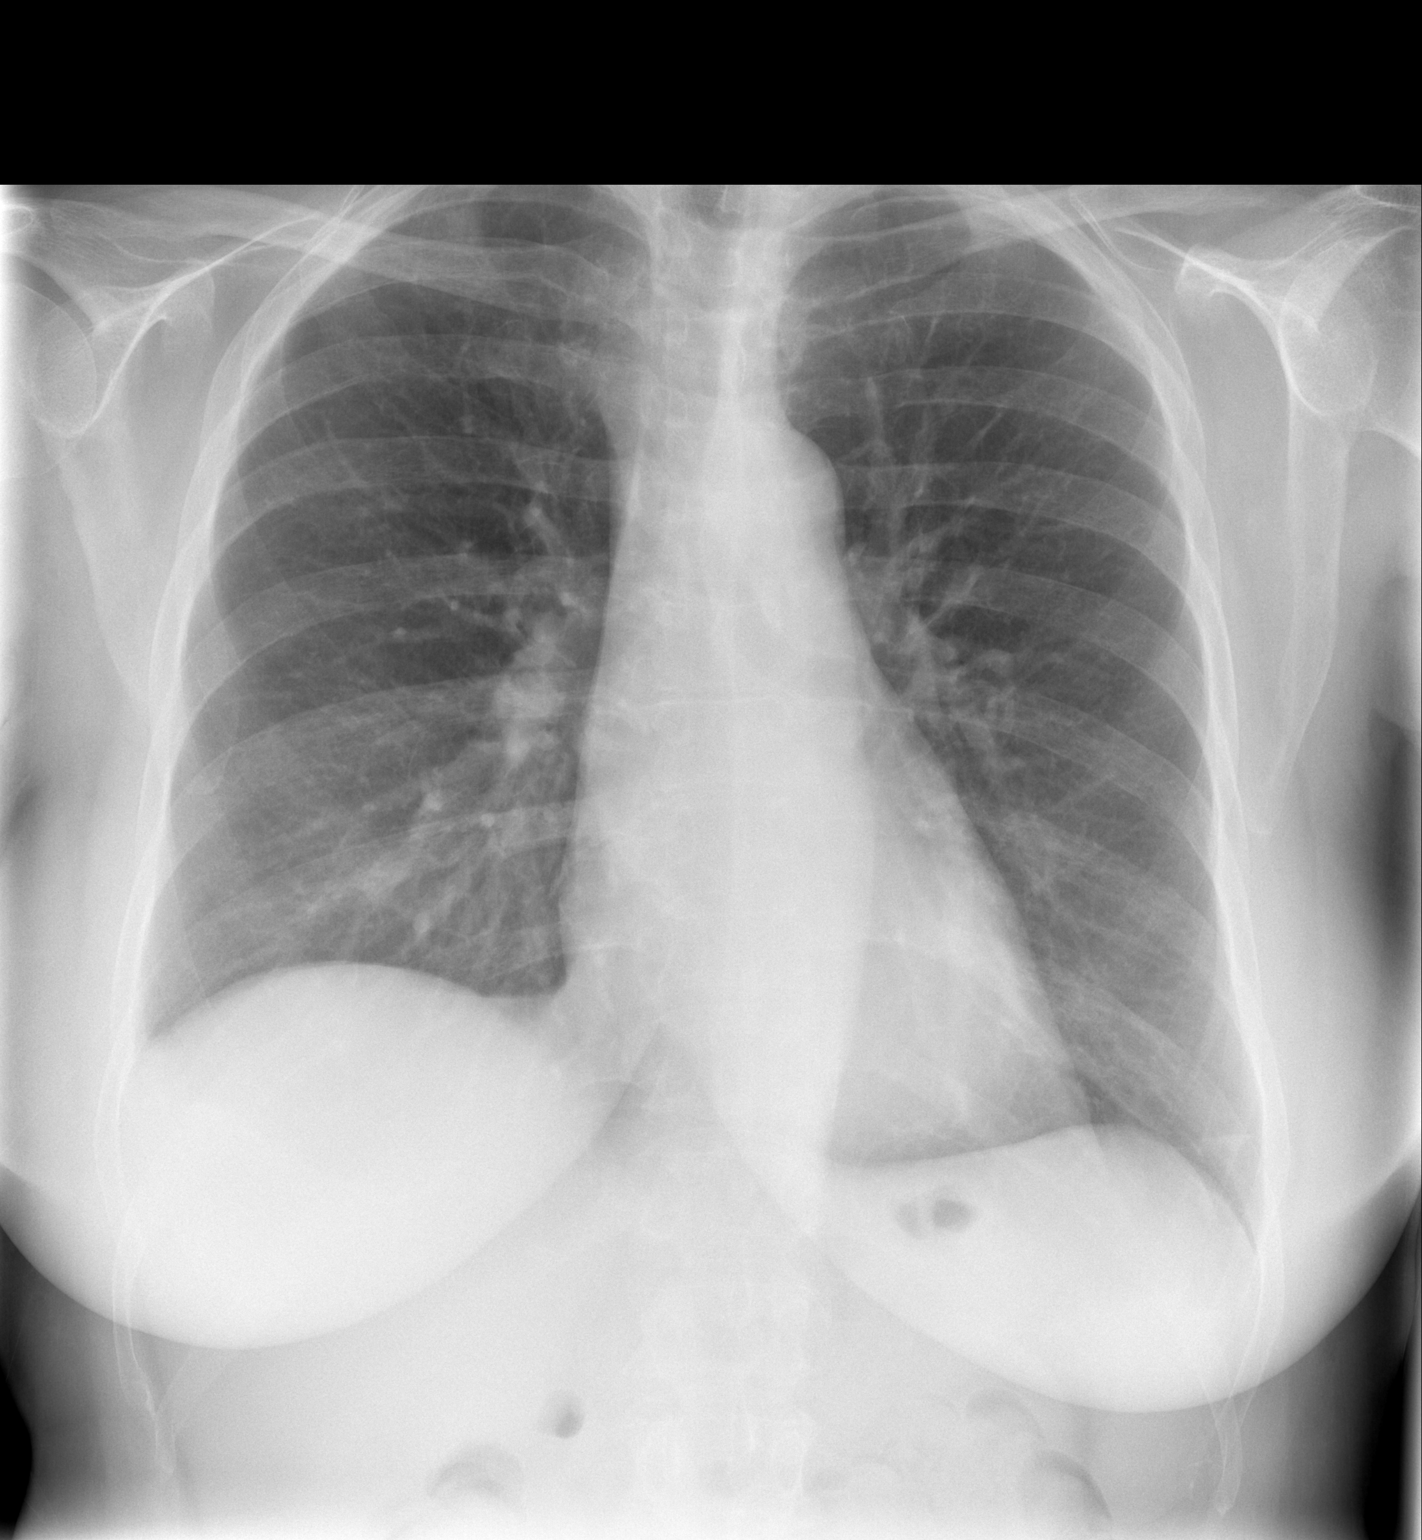

[w chest lat]
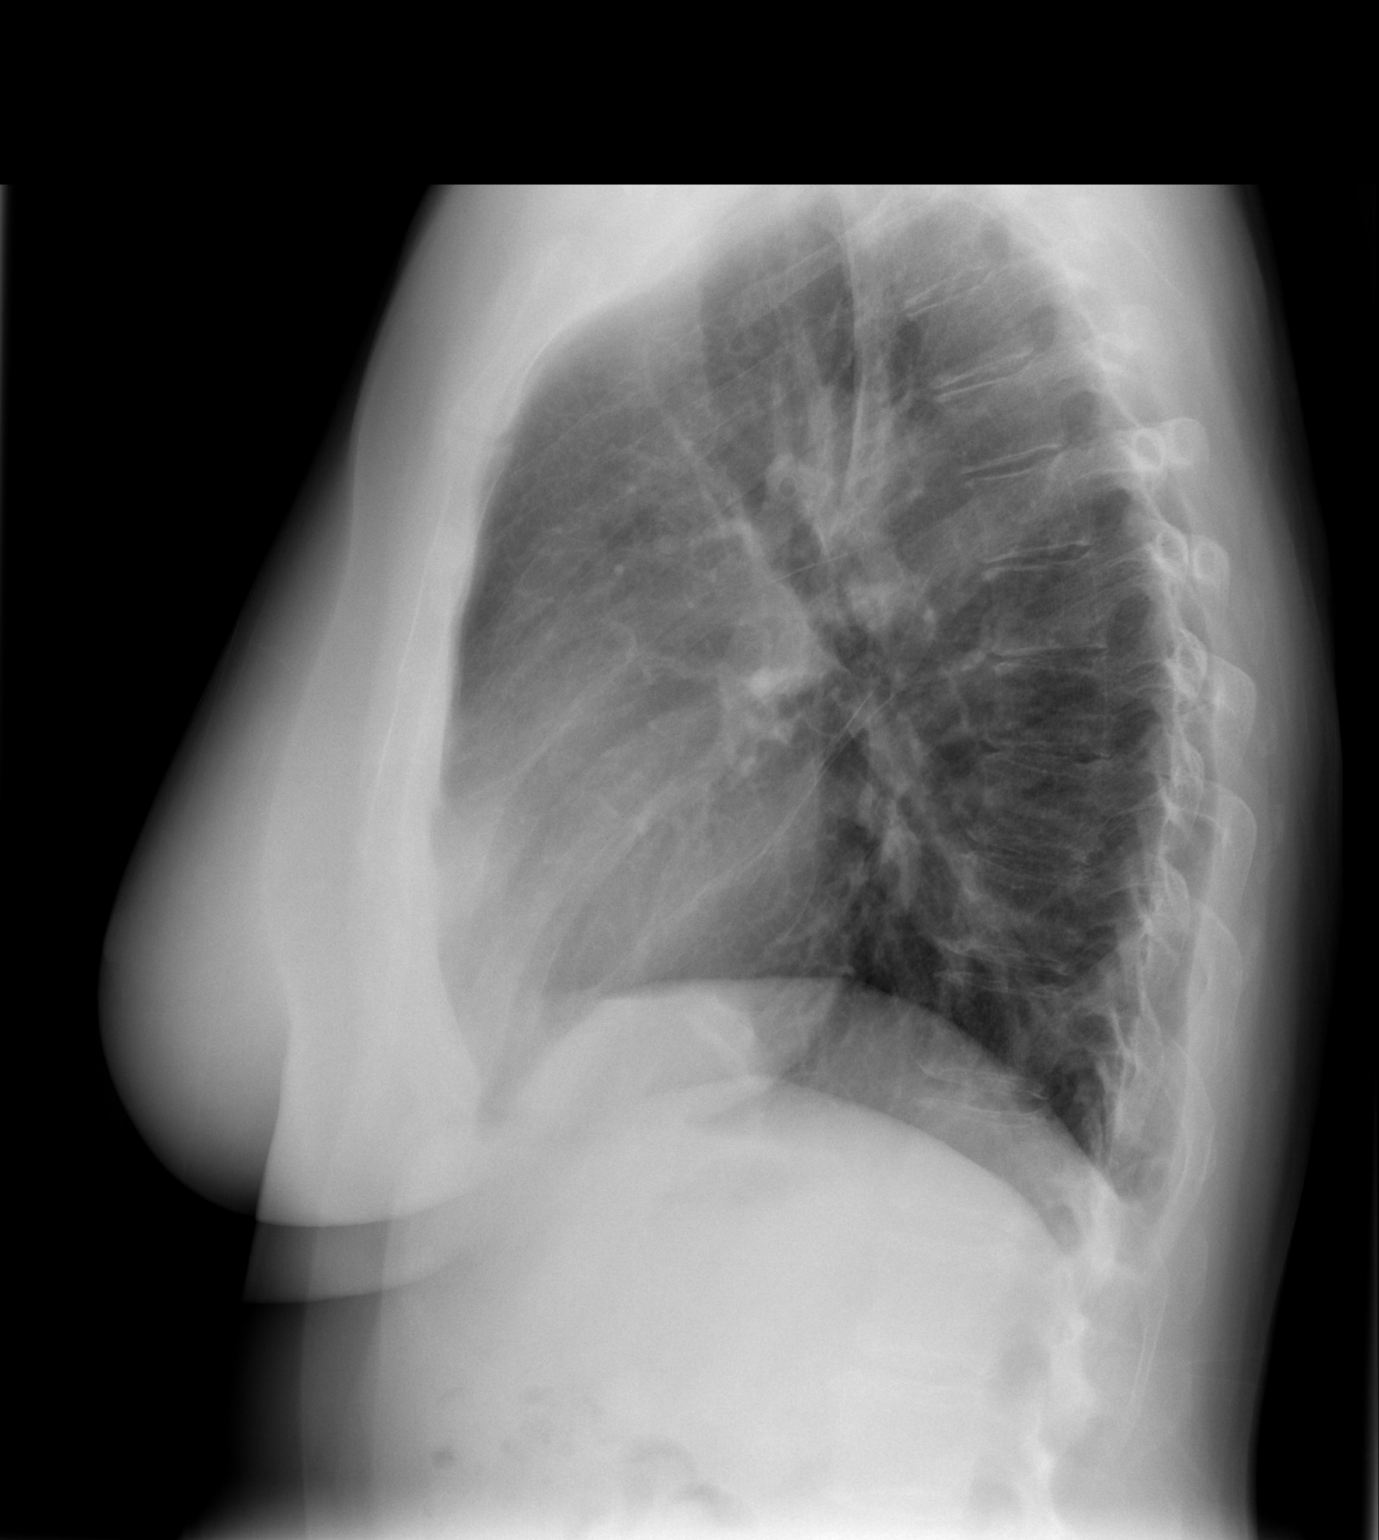

[2 of 2 positions shown; findings below may reference images not displayed]

FINDINGS: There is minimal subsegmental atelectasis in the bases.
No consolidative process.  No pneumothorax or effusion.  Heart size
normal.
IMPRESSION: Mild basilar atelectasis.

## 2016-02-17 DIAGNOSIS — H671 Otitis media in diseases classified elsewhere, right ear: Secondary | ICD-10-CM | POA: Diagnosis not present

## 2016-02-17 DIAGNOSIS — Z6825 Body mass index (BMI) 25.0-25.9, adult: Secondary | ICD-10-CM | POA: Diagnosis not present

## 2016-04-09 DIAGNOSIS — E1165 Type 2 diabetes mellitus with hyperglycemia: Secondary | ICD-10-CM | POA: Diagnosis not present

## 2016-04-09 DIAGNOSIS — I10 Essential (primary) hypertension: Secondary | ICD-10-CM | POA: Diagnosis not present

## 2016-04-09 DIAGNOSIS — Z6825 Body mass index (BMI) 25.0-25.9, adult: Secondary | ICD-10-CM | POA: Diagnosis not present

## 2016-04-16 DIAGNOSIS — Z1231 Encounter for screening mammogram for malignant neoplasm of breast: Secondary | ICD-10-CM | POA: Diagnosis not present

## 2016-04-16 DIAGNOSIS — R928 Other abnormal and inconclusive findings on diagnostic imaging of breast: Secondary | ICD-10-CM | POA: Diagnosis not present

## 2016-05-06 DIAGNOSIS — R928 Other abnormal and inconclusive findings on diagnostic imaging of breast: Secondary | ICD-10-CM | POA: Diagnosis not present

## 2016-07-10 DIAGNOSIS — I10 Essential (primary) hypertension: Secondary | ICD-10-CM | POA: Diagnosis not present

## 2016-07-10 DIAGNOSIS — E1165 Type 2 diabetes mellitus with hyperglycemia: Secondary | ICD-10-CM | POA: Diagnosis not present

## 2016-07-10 DIAGNOSIS — Z6824 Body mass index (BMI) 24.0-24.9, adult: Secondary | ICD-10-CM | POA: Diagnosis not present

## 2016-07-15 DIAGNOSIS — E1165 Type 2 diabetes mellitus with hyperglycemia: Secondary | ICD-10-CM | POA: Diagnosis not present

## 2016-07-15 DIAGNOSIS — I10 Essential (primary) hypertension: Secondary | ICD-10-CM | POA: Diagnosis not present

## 2016-09-24 DIAGNOSIS — H40053 Ocular hypertension, bilateral: Secondary | ICD-10-CM | POA: Diagnosis not present

## 2016-10-09 DIAGNOSIS — E1165 Type 2 diabetes mellitus with hyperglycemia: Secondary | ICD-10-CM | POA: Diagnosis not present

## 2016-10-09 DIAGNOSIS — Z6825 Body mass index (BMI) 25.0-25.9, adult: Secondary | ICD-10-CM | POA: Diagnosis not present

## 2016-10-09 DIAGNOSIS — I10 Essential (primary) hypertension: Secondary | ICD-10-CM | POA: Diagnosis not present

## 2016-10-23 DIAGNOSIS — H43811 Vitreous degeneration, right eye: Secondary | ICD-10-CM | POA: Diagnosis not present

## 2016-11-18 ENCOUNTER — Encounter (INDEPENDENT_AMBULATORY_CARE_PROVIDER_SITE_OTHER): Payer: BLUE CROSS/BLUE SHIELD | Admitting: Ophthalmology

## 2016-11-18 DIAGNOSIS — H35033 Hypertensive retinopathy, bilateral: Secondary | ICD-10-CM

## 2016-11-18 DIAGNOSIS — H353122 Nonexudative age-related macular degeneration, left eye, intermediate dry stage: Secondary | ICD-10-CM | POA: Diagnosis not present

## 2016-11-18 DIAGNOSIS — I10 Essential (primary) hypertension: Secondary | ICD-10-CM | POA: Diagnosis not present

## 2016-11-18 DIAGNOSIS — H43813 Vitreous degeneration, bilateral: Secondary | ICD-10-CM

## 2016-11-18 DIAGNOSIS — H2513 Age-related nuclear cataract, bilateral: Secondary | ICD-10-CM

## 2017-01-14 DIAGNOSIS — I10 Essential (primary) hypertension: Secondary | ICD-10-CM | POA: Diagnosis not present

## 2017-01-14 DIAGNOSIS — A681 Tick-borne relapsing fever: Secondary | ICD-10-CM | POA: Diagnosis not present

## 2017-01-14 DIAGNOSIS — Z6825 Body mass index (BMI) 25.0-25.9, adult: Secondary | ICD-10-CM | POA: Diagnosis not present

## 2017-01-14 DIAGNOSIS — E1165 Type 2 diabetes mellitus with hyperglycemia: Secondary | ICD-10-CM | POA: Diagnosis not present

## 2017-01-18 DIAGNOSIS — I10 Essential (primary) hypertension: Secondary | ICD-10-CM | POA: Diagnosis not present

## 2017-01-18 DIAGNOSIS — E1165 Type 2 diabetes mellitus with hyperglycemia: Secondary | ICD-10-CM | POA: Diagnosis not present

## 2017-04-01 DIAGNOSIS — M5136 Other intervertebral disc degeneration, lumbar region: Secondary | ICD-10-CM | POA: Diagnosis not present

## 2017-04-01 DIAGNOSIS — Z96643 Presence of artificial hip joint, bilateral: Secondary | ICD-10-CM | POA: Diagnosis not present

## 2017-04-01 DIAGNOSIS — E1165 Type 2 diabetes mellitus with hyperglycemia: Secondary | ICD-10-CM | POA: Diagnosis not present

## 2017-04-01 DIAGNOSIS — M545 Low back pain: Secondary | ICD-10-CM | POA: Diagnosis not present

## 2017-04-01 DIAGNOSIS — I10 Essential (primary) hypertension: Secondary | ICD-10-CM | POA: Diagnosis not present

## 2017-04-01 DIAGNOSIS — M85862 Other specified disorders of bone density and structure, left lower leg: Secondary | ICD-10-CM | POA: Diagnosis not present

## 2017-04-01 DIAGNOSIS — R102 Pelvic and perineal pain: Secondary | ICD-10-CM | POA: Diagnosis not present

## 2017-04-01 DIAGNOSIS — R079 Chest pain, unspecified: Secondary | ICD-10-CM | POA: Diagnosis not present

## 2017-04-01 DIAGNOSIS — Z6826 Body mass index (BMI) 26.0-26.9, adult: Secondary | ICD-10-CM | POA: Diagnosis not present

## 2017-04-06 DIAGNOSIS — S2241XA Multiple fractures of ribs, right side, initial encounter for closed fracture: Secondary | ICD-10-CM | POA: Diagnosis not present

## 2017-04-06 DIAGNOSIS — Z79899 Other long term (current) drug therapy: Secondary | ICD-10-CM | POA: Diagnosis not present

## 2017-04-06 DIAGNOSIS — G2581 Restless legs syndrome: Secondary | ICD-10-CM | POA: Diagnosis not present

## 2017-04-06 DIAGNOSIS — S7001XA Contusion of right hip, initial encounter: Secondary | ICD-10-CM | POA: Diagnosis not present

## 2017-04-06 DIAGNOSIS — S2231XA Fracture of one rib, right side, initial encounter for closed fracture: Secondary | ICD-10-CM | POA: Diagnosis not present

## 2017-04-06 DIAGNOSIS — S300XXA Contusion of lower back and pelvis, initial encounter: Secondary | ICD-10-CM | POA: Diagnosis not present

## 2017-05-06 DIAGNOSIS — H2513 Age-related nuclear cataract, bilateral: Secondary | ICD-10-CM | POA: Diagnosis not present

## 2017-05-06 DIAGNOSIS — H2512 Age-related nuclear cataract, left eye: Secondary | ICD-10-CM | POA: Diagnosis not present

## 2017-05-06 DIAGNOSIS — H353121 Nonexudative age-related macular degeneration, left eye, early dry stage: Secondary | ICD-10-CM | POA: Diagnosis not present

## 2017-05-06 DIAGNOSIS — H35372 Puckering of macula, left eye: Secondary | ICD-10-CM | POA: Diagnosis not present

## 2017-05-06 DIAGNOSIS — H353111 Nonexudative age-related macular degeneration, right eye, early dry stage: Secondary | ICD-10-CM | POA: Diagnosis not present

## 2017-05-06 DIAGNOSIS — H353131 Nonexudative age-related macular degeneration, bilateral, early dry stage: Secondary | ICD-10-CM | POA: Diagnosis not present

## 2017-06-08 DIAGNOSIS — H25811 Combined forms of age-related cataract, right eye: Secondary | ICD-10-CM | POA: Diagnosis not present

## 2017-06-08 DIAGNOSIS — H2511 Age-related nuclear cataract, right eye: Secondary | ICD-10-CM | POA: Diagnosis not present

## 2017-06-30 DIAGNOSIS — H25012 Cortical age-related cataract, left eye: Secondary | ICD-10-CM | POA: Diagnosis not present

## 2017-06-30 DIAGNOSIS — H2512 Age-related nuclear cataract, left eye: Secondary | ICD-10-CM | POA: Diagnosis not present

## 2017-07-01 DIAGNOSIS — M545 Low back pain: Secondary | ICD-10-CM | POA: Diagnosis not present

## 2017-07-01 DIAGNOSIS — Z6826 Body mass index (BMI) 26.0-26.9, adult: Secondary | ICD-10-CM | POA: Diagnosis not present

## 2017-07-01 DIAGNOSIS — E1165 Type 2 diabetes mellitus with hyperglycemia: Secondary | ICD-10-CM | POA: Diagnosis not present

## 2017-07-01 DIAGNOSIS — I10 Essential (primary) hypertension: Secondary | ICD-10-CM | POA: Diagnosis not present

## 2017-07-06 DIAGNOSIS — H25812 Combined forms of age-related cataract, left eye: Secondary | ICD-10-CM | POA: Diagnosis not present

## 2017-07-06 DIAGNOSIS — H2512 Age-related nuclear cataract, left eye: Secondary | ICD-10-CM | POA: Diagnosis not present

## 2017-07-27 DIAGNOSIS — Z961 Presence of intraocular lens: Secondary | ICD-10-CM | POA: Diagnosis not present

## 2017-09-30 DIAGNOSIS — G2581 Restless legs syndrome: Secondary | ICD-10-CM | POA: Diagnosis not present

## 2017-09-30 DIAGNOSIS — E119 Type 2 diabetes mellitus without complications: Secondary | ICD-10-CM | POA: Diagnosis not present

## 2017-09-30 DIAGNOSIS — I10 Essential (primary) hypertension: Secondary | ICD-10-CM | POA: Diagnosis not present

## 2017-09-30 DIAGNOSIS — E7849 Other hyperlipidemia: Secondary | ICD-10-CM | POA: Diagnosis not present

## 2017-10-06 DIAGNOSIS — Z6826 Body mass index (BMI) 26.0-26.9, adult: Secondary | ICD-10-CM | POA: Diagnosis not present

## 2017-10-06 DIAGNOSIS — E7849 Other hyperlipidemia: Secondary | ICD-10-CM | POA: Diagnosis not present

## 2017-10-06 DIAGNOSIS — E119 Type 2 diabetes mellitus without complications: Secondary | ICD-10-CM | POA: Diagnosis not present

## 2017-10-06 DIAGNOSIS — G2581 Restless legs syndrome: Secondary | ICD-10-CM | POA: Diagnosis not present

## 2017-10-06 DIAGNOSIS — I10 Essential (primary) hypertension: Secondary | ICD-10-CM | POA: Diagnosis not present

## 2017-12-30 DIAGNOSIS — G2581 Restless legs syndrome: Secondary | ICD-10-CM | POA: Diagnosis not present

## 2017-12-30 DIAGNOSIS — E119 Type 2 diabetes mellitus without complications: Secondary | ICD-10-CM | POA: Diagnosis not present

## 2017-12-30 DIAGNOSIS — I10 Essential (primary) hypertension: Secondary | ICD-10-CM | POA: Diagnosis not present

## 2017-12-30 DIAGNOSIS — E7849 Other hyperlipidemia: Secondary | ICD-10-CM | POA: Diagnosis not present

## 2018-03-31 DIAGNOSIS — I10 Essential (primary) hypertension: Secondary | ICD-10-CM | POA: Diagnosis not present

## 2018-03-31 DIAGNOSIS — G2581 Restless legs syndrome: Secondary | ICD-10-CM | POA: Diagnosis not present

## 2018-03-31 DIAGNOSIS — E7849 Other hyperlipidemia: Secondary | ICD-10-CM | POA: Diagnosis not present

## 2018-03-31 DIAGNOSIS — E119 Type 2 diabetes mellitus without complications: Secondary | ICD-10-CM | POA: Diagnosis not present

## 2018-04-01 DIAGNOSIS — E119 Type 2 diabetes mellitus without complications: Secondary | ICD-10-CM | POA: Diagnosis not present

## 2018-04-01 DIAGNOSIS — E7849 Other hyperlipidemia: Secondary | ICD-10-CM | POA: Diagnosis not present

## 2018-04-01 DIAGNOSIS — Z6824 Body mass index (BMI) 24.0-24.9, adult: Secondary | ICD-10-CM | POA: Diagnosis not present

## 2018-04-01 DIAGNOSIS — I10 Essential (primary) hypertension: Secondary | ICD-10-CM | POA: Diagnosis not present

## 2018-04-01 DIAGNOSIS — Z79899 Other long term (current) drug therapy: Secondary | ICD-10-CM | POA: Diagnosis not present

## 2018-04-01 DIAGNOSIS — G2581 Restless legs syndrome: Secondary | ICD-10-CM | POA: Diagnosis not present

## 2018-06-30 DIAGNOSIS — M79672 Pain in left foot: Secondary | ICD-10-CM | POA: Diagnosis not present

## 2018-06-30 DIAGNOSIS — M722 Plantar fascial fibromatosis: Secondary | ICD-10-CM | POA: Diagnosis not present

## 2018-06-30 DIAGNOSIS — M79671 Pain in right foot: Secondary | ICD-10-CM | POA: Diagnosis not present

## 2018-07-06 DIAGNOSIS — Z0000 Encounter for general adult medical examination without abnormal findings: Secondary | ICD-10-CM | POA: Diagnosis not present

## 2018-07-06 DIAGNOSIS — Z6823 Body mass index (BMI) 23.0-23.9, adult: Secondary | ICD-10-CM | POA: Diagnosis not present

## 2018-07-21 DIAGNOSIS — M79671 Pain in right foot: Secondary | ICD-10-CM | POA: Diagnosis not present

## 2018-07-21 DIAGNOSIS — M722 Plantar fascial fibromatosis: Secondary | ICD-10-CM | POA: Diagnosis not present

## 2018-07-21 DIAGNOSIS — M79672 Pain in left foot: Secondary | ICD-10-CM | POA: Diagnosis not present

## 2018-08-11 DIAGNOSIS — M79672 Pain in left foot: Secondary | ICD-10-CM | POA: Diagnosis not present

## 2018-08-11 DIAGNOSIS — M810 Age-related osteoporosis without current pathological fracture: Secondary | ICD-10-CM | POA: Diagnosis not present

## 2018-08-11 DIAGNOSIS — M79671 Pain in right foot: Secondary | ICD-10-CM | POA: Diagnosis not present

## 2018-08-11 DIAGNOSIS — M722 Plantar fascial fibromatosis: Secondary | ICD-10-CM | POA: Diagnosis not present

## 2018-08-11 DIAGNOSIS — M85832 Other specified disorders of bone density and structure, left forearm: Secondary | ICD-10-CM | POA: Diagnosis not present

## 2018-10-05 DIAGNOSIS — I10 Essential (primary) hypertension: Secondary | ICD-10-CM | POA: Diagnosis not present

## 2018-10-05 DIAGNOSIS — E119 Type 2 diabetes mellitus without complications: Secondary | ICD-10-CM | POA: Diagnosis not present

## 2018-10-05 DIAGNOSIS — G2581 Restless legs syndrome: Secondary | ICD-10-CM | POA: Diagnosis not present

## 2018-10-05 DIAGNOSIS — E785 Hyperlipidemia, unspecified: Secondary | ICD-10-CM | POA: Diagnosis not present

## 2019-01-05 DIAGNOSIS — E785 Hyperlipidemia, unspecified: Secondary | ICD-10-CM | POA: Diagnosis not present

## 2019-01-05 DIAGNOSIS — I10 Essential (primary) hypertension: Secondary | ICD-10-CM | POA: Diagnosis not present

## 2019-01-05 DIAGNOSIS — E119 Type 2 diabetes mellitus without complications: Secondary | ICD-10-CM | POA: Diagnosis not present

## 2019-01-05 DIAGNOSIS — M545 Low back pain: Secondary | ICD-10-CM | POA: Diagnosis not present

## 2019-01-13 DIAGNOSIS — G2581 Restless legs syndrome: Secondary | ICD-10-CM | POA: Diagnosis not present

## 2019-01-13 DIAGNOSIS — E119 Type 2 diabetes mellitus without complications: Secondary | ICD-10-CM | POA: Diagnosis not present

## 2019-01-13 DIAGNOSIS — E785 Hyperlipidemia, unspecified: Secondary | ICD-10-CM | POA: Diagnosis not present

## 2019-01-13 DIAGNOSIS — I10 Essential (primary) hypertension: Secondary | ICD-10-CM | POA: Diagnosis not present

## 2019-02-20 DIAGNOSIS — Z6825 Body mass index (BMI) 25.0-25.9, adult: Secondary | ICD-10-CM | POA: Diagnosis not present

## 2019-02-20 DIAGNOSIS — L0390 Cellulitis, unspecified: Secondary | ICD-10-CM | POA: Diagnosis not present

## 2019-04-06 DIAGNOSIS — E7849 Other hyperlipidemia: Secondary | ICD-10-CM | POA: Diagnosis not present

## 2019-04-06 DIAGNOSIS — Z6825 Body mass index (BMI) 25.0-25.9, adult: Secondary | ICD-10-CM | POA: Diagnosis not present

## 2019-04-06 DIAGNOSIS — I10 Essential (primary) hypertension: Secondary | ICD-10-CM | POA: Diagnosis not present

## 2019-04-06 DIAGNOSIS — E119 Type 2 diabetes mellitus without complications: Secondary | ICD-10-CM | POA: Diagnosis not present

## 2019-06-02 DIAGNOSIS — H353122 Nonexudative age-related macular degeneration, left eye, intermediate dry stage: Secondary | ICD-10-CM | POA: Diagnosis not present

## 2019-07-06 DIAGNOSIS — Z6825 Body mass index (BMI) 25.0-25.9, adult: Secondary | ICD-10-CM | POA: Diagnosis not present

## 2019-07-06 DIAGNOSIS — E119 Type 2 diabetes mellitus without complications: Secondary | ICD-10-CM | POA: Diagnosis not present

## 2019-07-06 DIAGNOSIS — I10 Essential (primary) hypertension: Secondary | ICD-10-CM | POA: Diagnosis not present

## 2019-07-06 DIAGNOSIS — E7849 Other hyperlipidemia: Secondary | ICD-10-CM | POA: Diagnosis not present

## 2019-10-05 DIAGNOSIS — Z0000 Encounter for general adult medical examination without abnormal findings: Secondary | ICD-10-CM | POA: Diagnosis not present

## 2019-10-05 DIAGNOSIS — Z6825 Body mass index (BMI) 25.0-25.9, adult: Secondary | ICD-10-CM | POA: Diagnosis not present

## 2019-10-06 DIAGNOSIS — I10 Essential (primary) hypertension: Secondary | ICD-10-CM | POA: Diagnosis not present

## 2019-10-06 DIAGNOSIS — E782 Mixed hyperlipidemia: Secondary | ICD-10-CM | POA: Diagnosis not present

## 2019-10-06 DIAGNOSIS — D649 Anemia, unspecified: Secondary | ICD-10-CM | POA: Diagnosis not present

## 2019-12-18 DIAGNOSIS — Z1231 Encounter for screening mammogram for malignant neoplasm of breast: Secondary | ICD-10-CM | POA: Diagnosis not present

## 2020-01-03 DIAGNOSIS — I10 Essential (primary) hypertension: Secondary | ICD-10-CM | POA: Diagnosis not present

## 2020-01-03 DIAGNOSIS — Z6825 Body mass index (BMI) 25.0-25.9, adult: Secondary | ICD-10-CM | POA: Diagnosis not present

## 2020-01-03 DIAGNOSIS — E119 Type 2 diabetes mellitus without complications: Secondary | ICD-10-CM | POA: Diagnosis not present

## 2020-01-03 DIAGNOSIS — E7849 Other hyperlipidemia: Secondary | ICD-10-CM | POA: Diagnosis not present

## 2020-03-27 DIAGNOSIS — Z6825 Body mass index (BMI) 25.0-25.9, adult: Secondary | ICD-10-CM | POA: Diagnosis not present

## 2020-03-27 DIAGNOSIS — E7849 Other hyperlipidemia: Secondary | ICD-10-CM | POA: Diagnosis not present

## 2020-03-27 DIAGNOSIS — E119 Type 2 diabetes mellitus without complications: Secondary | ICD-10-CM | POA: Diagnosis not present

## 2020-03-27 DIAGNOSIS — I10 Essential (primary) hypertension: Secondary | ICD-10-CM | POA: Diagnosis not present

## 2020-06-27 DIAGNOSIS — Z6825 Body mass index (BMI) 25.0-25.9, adult: Secondary | ICD-10-CM | POA: Diagnosis not present

## 2020-06-27 DIAGNOSIS — E7849 Other hyperlipidemia: Secondary | ICD-10-CM | POA: Diagnosis not present

## 2020-06-27 DIAGNOSIS — I10 Essential (primary) hypertension: Secondary | ICD-10-CM | POA: Diagnosis not present

## 2020-06-27 DIAGNOSIS — E1169 Type 2 diabetes mellitus with other specified complication: Secondary | ICD-10-CM | POA: Diagnosis not present

## 2020-09-26 DIAGNOSIS — I10 Essential (primary) hypertension: Secondary | ICD-10-CM | POA: Diagnosis not present

## 2020-09-26 DIAGNOSIS — G2581 Restless legs syndrome: Secondary | ICD-10-CM | POA: Diagnosis not present

## 2020-09-26 DIAGNOSIS — E1169 Type 2 diabetes mellitus with other specified complication: Secondary | ICD-10-CM | POA: Diagnosis not present

## 2020-09-26 DIAGNOSIS — E7849 Other hyperlipidemia: Secondary | ICD-10-CM | POA: Diagnosis not present

## 2020-11-27 DIAGNOSIS — H35373 Puckering of macula, bilateral: Secondary | ICD-10-CM | POA: Diagnosis not present

## 2020-12-26 DIAGNOSIS — I10 Essential (primary) hypertension: Secondary | ICD-10-CM | POA: Diagnosis not present

## 2020-12-26 DIAGNOSIS — G2581 Restless legs syndrome: Secondary | ICD-10-CM | POA: Diagnosis not present

## 2020-12-26 DIAGNOSIS — E1169 Type 2 diabetes mellitus with other specified complication: Secondary | ICD-10-CM | POA: Diagnosis not present

## 2020-12-26 DIAGNOSIS — E7849 Other hyperlipidemia: Secondary | ICD-10-CM | POA: Diagnosis not present

## 2021-01-03 DIAGNOSIS — E7849 Other hyperlipidemia: Secondary | ICD-10-CM | POA: Diagnosis not present

## 2021-01-03 DIAGNOSIS — I10 Essential (primary) hypertension: Secondary | ICD-10-CM | POA: Diagnosis not present

## 2021-01-03 DIAGNOSIS — G2581 Restless legs syndrome: Secondary | ICD-10-CM | POA: Diagnosis not present

## 2021-01-03 DIAGNOSIS — E1169 Type 2 diabetes mellitus with other specified complication: Secondary | ICD-10-CM | POA: Diagnosis not present

## 2021-02-20 DIAGNOSIS — I10 Essential (primary) hypertension: Secondary | ICD-10-CM | POA: Diagnosis not present

## 2021-02-20 DIAGNOSIS — G2581 Restless legs syndrome: Secondary | ICD-10-CM | POA: Diagnosis not present

## 2021-02-20 DIAGNOSIS — E1169 Type 2 diabetes mellitus with other specified complication: Secondary | ICD-10-CM | POA: Diagnosis not present

## 2021-02-20 DIAGNOSIS — E7849 Other hyperlipidemia: Secondary | ICD-10-CM | POA: Diagnosis not present

## 2021-03-19 DIAGNOSIS — M2011 Hallux valgus (acquired), right foot: Secondary | ICD-10-CM | POA: Diagnosis not present

## 2021-03-19 DIAGNOSIS — M2042 Other hammer toe(s) (acquired), left foot: Secondary | ICD-10-CM | POA: Diagnosis not present

## 2021-03-19 DIAGNOSIS — M2012 Hallux valgus (acquired), left foot: Secondary | ICD-10-CM | POA: Diagnosis not present

## 2021-03-19 DIAGNOSIS — M2041 Other hammer toe(s) (acquired), right foot: Secondary | ICD-10-CM | POA: Diagnosis not present

## 2021-03-27 DIAGNOSIS — I10 Essential (primary) hypertension: Secondary | ICD-10-CM | POA: Diagnosis not present

## 2021-03-27 DIAGNOSIS — E1169 Type 2 diabetes mellitus with other specified complication: Secondary | ICD-10-CM | POA: Diagnosis not present

## 2021-03-27 DIAGNOSIS — M5432 Sciatica, left side: Secondary | ICD-10-CM | POA: Diagnosis not present

## 2021-03-27 DIAGNOSIS — E7849 Other hyperlipidemia: Secondary | ICD-10-CM | POA: Diagnosis not present

## 2021-03-28 DIAGNOSIS — M47814 Spondylosis without myelopathy or radiculopathy, thoracic region: Secondary | ICD-10-CM | POA: Diagnosis not present

## 2021-03-28 DIAGNOSIS — M545 Low back pain, unspecified: Secondary | ICD-10-CM | POA: Diagnosis not present

## 2021-03-28 DIAGNOSIS — M47815 Spondylosis without myelopathy or radiculopathy, thoracolumbar region: Secondary | ICD-10-CM | POA: Diagnosis not present

## 2021-03-28 DIAGNOSIS — M546 Pain in thoracic spine: Secondary | ICD-10-CM | POA: Diagnosis not present

## 2021-03-28 DIAGNOSIS — M47816 Spondylosis without myelopathy or radiculopathy, lumbar region: Secondary | ICD-10-CM | POA: Diagnosis not present

## 2021-03-28 DIAGNOSIS — M4317 Spondylolisthesis, lumbosacral region: Secondary | ICD-10-CM | POA: Diagnosis not present

## 2021-04-09 ENCOUNTER — Encounter: Payer: Self-pay | Admitting: Orthopaedic Surgery

## 2021-04-09 ENCOUNTER — Ambulatory Visit (INDEPENDENT_AMBULATORY_CARE_PROVIDER_SITE_OTHER): Payer: BC Managed Care – PPO | Admitting: Orthopaedic Surgery

## 2021-04-09 ENCOUNTER — Other Ambulatory Visit: Payer: Self-pay

## 2021-04-09 DIAGNOSIS — M79605 Pain in left leg: Secondary | ICD-10-CM | POA: Diagnosis not present

## 2021-04-09 NOTE — Progress Notes (Signed)
Office Visit Note   Patient: Cheyenne Perez           Date of Birth: [birth year]           MRN: [id number] Visit Date: 04/09/2021              Requested by: No referring provider defined for this encounter. PCP: No primary care provider on file.   Assessment & Plan: Visit Diagnoses:  1. Pain in left leg     Plan: Mrs. Coppa is complaining of pain along the lateral aspect of her left leg between the knee and the ankle.  She had initial onset of low back pain several weeks ago was treated by her primary care physician with a combination of a cortisone injection and prednisone.  That resolved the back pain.  She continues to have pain to the point where she is using a cane along the lateral aspect of her calf.  There is been no swelling.  She denies any knee pain.  There have been no skin changes.  She has not had any numbness or tingling or bowel or bladder changes.  She does have weakness with dorsiflexion of her foot and a decreased left ankle reflex.  Up that she has a ruptured disc and will order an MRI scan. I did review the films of the lumbar spine dated June 3 that demonstrated slight anterior listhesis of L5 and S1 with facet joint changes at L4-5 and more so at L5-S1 no evidence of a scoliosis  Follow-Up Instructions: Return After MRI lumbar spine.   Orders:  No orders of the defined types were placed in this encounter.  No orders of the defined types were placed in this encounter.     Procedures: No procedures performed   Clinical Data: No additional findings.   Subjective: Chief Complaint  Patient presents with   Left Hip - Pain  Patient presents today for left hip pain. Pain started on 03/25/2021. She said that the pain radiates down to her left calf. She originally had lower back pain. She saw her PCP and was send for x-rays. She states that the x-rays were negative. She is now using a cane or crutches to help with ambulation. She had a prednisone pack and that  did help with her back.  No related knee pain.  Not having any numbness or tingling just deep pain in the lateral aspect of her left leg between the knee and the ankle  HPI  Review of Systems   Objective: Vital Signs: Ht 5\' 4"  (1.626 m)   Wt 155 lb (70.3 kg)   BMI 26.61 kg/m   Physical Exam Constitutional:      Appearance: She is well-developed.  Eyes:     Pupils: Pupils are equal, round, and reactive to light.  Pulmonary:     Effort: Pulmonary effort is normal.  Skin:    General: Skin is warm and dry.  Neurological:     Mental Status: She is alert and oriented to person, place, and time.  Psychiatric:        Behavior: Behavior normal.    Ortho Exam awake alert and oriented x3.  Comfortable sitting.  Straight leg raise negative bilaterally.  There is weakness of dorsiflexion of her left foot and a decreased left ankle reflex.  There was no pain to palpation of the left calf or along the peroneal compartment where she is complaining of deep pain when she is up and about.  Pain could be persistent and not related to her knee.  Her knee exam was benign without evidence of skin changes or effusion or local tenderness.  Painless range of motion of left hip.  No percussible back pain  Specialty Comments:  No specialty comments available.  Imaging: No results found.   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   Restless leg     Family History  Problem Relation Age of Onset   Anesthesia problems Neg Hx    Hypotension Neg Hx    Malignant hyperthermia Neg Hx    Pseudochol  deficiency Neg Hx     Past Surgical History:  Procedure Laterality Date   BASAL CELL CARCINOMA EXCISION     L lower eyelid   JOINT REPLACEMENT     L hip- 2009   TONSILLECTOMY     as a child   TOTAL HIP ARTHROPLASTY  11/03/2011   Procedure: TOTAL HIP ARTHROPLASTY;  Surgeon: Valeria Batman, MD;  Location: [facility] OR;  Service: Orthopedics;  Laterality: Right;   Social History   Occupational History   Not on file  Tobacco Use   Smoking status: Never   Smokeless tobacco: Not on file  Substance and Sexual Activity   Alcohol use: No   Drug use: No   Sexual activity: Not on file

## 2021-04-09 NOTE — Addendum Note (Signed)
Addended by: Wendi Maya on: 04/09/2021 01:36 PM   Modules accepted: Orders

## 2021-04-22 DIAGNOSIS — G5702 Lesion of sciatic nerve, left lower limb: Secondary | ICD-10-CM | POA: Diagnosis not present

## 2021-04-22 DIAGNOSIS — M79605 Pain in left leg: Secondary | ICD-10-CM | POA: Diagnosis not present

## 2021-04-22 DIAGNOSIS — M47816 Spondylosis without myelopathy or radiculopathy, lumbar region: Secondary | ICD-10-CM | POA: Diagnosis not present

## 2021-04-23 ENCOUNTER — Ambulatory Visit (INDEPENDENT_AMBULATORY_CARE_PROVIDER_SITE_OTHER): Payer: BC Managed Care – PPO | Admitting: Orthopaedic Surgery

## 2021-04-23 ENCOUNTER — Other Ambulatory Visit: Payer: Self-pay

## 2021-04-23 ENCOUNTER — Encounter: Payer: Self-pay | Admitting: Orthopaedic Surgery

## 2021-04-23 DIAGNOSIS — M79605 Pain in left leg: Secondary | ICD-10-CM

## 2021-04-23 NOTE — Progress Notes (Signed)
Office Visit Note   Patient: Cheyenne Perez           Date of Birth: 1946-06-12           MRN: [id number] Visit Date: 04/23/2021              Requested by: No referring provider defined for this encounter. PCP: No primary care provider on file.   Assessment & Plan: Visit Diagnoses:  1. Pain in left leg     Plan: Mrs. Tatro had an MRI scan of her lumbar spine.  She continues to have unrelenting pain in the lateral left calf associated with clinical dorsiflexion weakness of her foot and a decreased ankle reflex.  The MRI scan demonstrated multilevel degenerative changes throughout the lumbar spine but there was significant left S1 nerve root impingement which appeared to be due to advanced facet degeneration on the left and significant epidural soft tissue thickening compressing the left S1 nerve root this could be a complex synovial cyst versus a disc fragment or even a ligament hypertrophy related to the facet degeneration.  She has had several cortisone injections and a course of prednisone per her primary care physician.  She is taking BC for pain but did not want anything stronger.  We will asked Dr. Alvester Morin to evaluate her for consideration of an epidural steroid injection or even a more local injection at the L5-S1 left facet  Follow-Up Instructions: Return Refer to Dr. Alvester Morin urgently for injection.   Orders:  Orders Placed This Encounter  Procedures   Ambulatory referral to Physical Medicine Rehab   No orders of the defined types were placed in this encounter.     Procedures: No procedures performed   Clinical Data: No additional findings.   Subjective: Chief Complaint  Patient presents with   Lower Back - Follow-up    MRI review  Patient presents today for follow up on her lower back. She had an MRI and is here today to go over those results.  No change in his symptoms.  Continues to have unrelenting pain in the lateral left calf.  Minimal back pain.  No bowel or  bladder changes.  Does use a cane because of her pain and taking BC for discomfort  HPI  Review of Systems   Objective: Vital Signs: There were no vitals taken for this visit.  Physical Exam Constitutional:      Appearance: She is well-developed.  Pulmonary:     Effort: Pulmonary effort is normal.  Skin:    General: Skin is warm and dry.  Neurological:     Mental Status: She is alert and oriented to person, place, and time.  Psychiatric:        Behavior: Behavior normal.    Ortho Exam awake alert and oriented x3.  Comfortable sitting.  No acute distress but relates still having pain along the lateral left calf.  Still has a little weakness with dorsiflexion of the foot and a decreased left ankle reflex.  Straight leg raise was negative.  No percussible back tenderness.  Seems to have good sensation of the foot and good capillary refill.  No knee pain.  Specialty Comments:  No specialty comments available.  Imaging: No results found.   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   Restless leg     Family History  Problem Relation Age of Onset   Anesthesia problems Neg Hx    Hypotension Neg Hx    Malignant hyperthermia Neg Hx    Pseudochol deficiency Neg Hx     Past Surgical History:  Procedure Laterality Date   BASAL CELL CARCINOMA EXCISION     L lower eyelid   JOINT REPLACEMENT     L hip- 2009   TONSILLECTOMY     as a child   TOTAL HIP ARTHROPLASTY  11/03/2011   Procedure: TOTAL HIP ARTHROPLASTY;  Surgeon: Valeria Batman, MD;  Location: [facility] OR;  Service:  Orthopedics;  Laterality: Right;   Social History   Occupational History   Not on file  Tobacco Use   Smoking status: Never   Smokeless tobacco: Not on file  Substance and Sexual Activity   Alcohol use: No   Drug use: No   Sexual activity: Not on file

## 2021-04-24 ENCOUNTER — Telehealth: Payer: Self-pay | Admitting: Physical Medicine and Rehabilitation

## 2021-04-24 MED ORDER — TRAMADOL HCL 50 MG PO TABS: 50.0000 mg | ORAL_TABLET | Freq: Three times a day (TID) | ORAL | 0 refills | Status: DC | PRN

## 2021-04-24 NOTE — Telephone Encounter (Signed)
Appointment Scheduled

## 2021-04-24 NOTE — Telephone Encounter (Signed)
Patient returned call asked for a call back. The number to contact patient is [phone]

## 2021-04-24 NOTE — Telephone Encounter (Signed)
Pt returning a call for [place]. The best call back number [phone] (this is her work number and is a direct line to her desk so she will be the one answering).

## 2021-04-24 NOTE — Telephone Encounter (Signed)
Appointment scheduled.

## 2021-05-12 ENCOUNTER — Other Ambulatory Visit: Payer: Self-pay

## 2021-05-12 ENCOUNTER — Ambulatory Visit (INDEPENDENT_AMBULATORY_CARE_PROVIDER_SITE_OTHER): Payer: BC Managed Care – PPO | Admitting: Physical Medicine and Rehabilitation

## 2021-05-12 ENCOUNTER — Encounter: Payer: Self-pay | Admitting: Physical Medicine and Rehabilitation

## 2021-05-12 ENCOUNTER — Ambulatory Visit: Payer: Self-pay

## 2021-05-12 VITALS — BP 137/74 | HR 91

## 2021-05-12 DIAGNOSIS — M5416 Radiculopathy, lumbar region: Secondary | ICD-10-CM | POA: Diagnosis not present

## 2021-05-12 MED ORDER — METHYLPREDNISOLONE ACETATE 80 MG/ML IJ SUSP
80.0000 mg | Freq: Once | INTRAMUSCULAR | Status: AC
  Administered 2021-05-12: 80 mg

## 2021-05-12 NOTE — Patient Instructions (Signed)

## 2021-05-12 NOTE — Progress Notes (Signed)
Pt state lower back pain the travels down to the left knee and ankle. Pt state walking and standing makes the pain worse. Pt state she can't put weight on her legs. Pt state she takes pain meds and heating pads to help ease her pain.  Numeric Pain Rating Scale and Functional Assessment Average Pain 6   In the last MONTH (on 0-10 scale) has pain interfered with the following?  1. General activity like being  able to carry out your everyday physical activities such as walking, climbing stairs, carrying groceries, or moving a chair?  Rating(10)   +Driver, +BT, -Dye Allergies.

## 2021-05-14 NOTE — Procedures (Signed)
S1 Lumbosacral Transforaminal Epidural Steroid Injection - Sub-Pedicular Approach with Fluoroscopic Guidance   Patient: Cheyenne Perez      Date of Birth: 1946-10-21 MRN: [id number] PCP: No primary care provider on file.      Visit Date: 05/12/2021   Universal Protocol:    Date/Time: 07/20/226:03 AM  Consent Given By: the patient  Position:  PRONE  Additional Comments: Vital signs were monitored before and after the procedure. Patient was prepped and draped in the usual sterile fashion. The correct patient, procedure, and site was verified.   Injection Procedure Details:  Procedure Site One Meds Administered:  Meds ordered this encounter  Medications   methylPREDNISolone acetate (DEPO-MEDROL) injection 80 mg    Laterality: Left  Location/Site:  S1 Foramen   Needle size: 22 ga.  Needle type: Spinal  Needle Placement: Transforaminal  Findings:   -Comments: Excellent flow of contrast along the nerve, nerve root and into the epidural space.  Epidurogram: Contrast epidurogram showed no nerve root cut off or restricted flow pattern.  Procedure Details: After squaring off the sacral end-plate to get a true AP view, the C-arm was positioned so that the best possible view of the S1 foramen was visualized. The soft tissues overlying this structure were infiltrated with 2-3 ml. of 1% Lidocaine without Epinephrine.    The spinal needle was inserted toward the target using a "trajectory" view along the fluoroscope beam.  Under AP and lateral visualization, the needle was advanced so it did not puncture dura. Biplanar projections were used to confirm position. Aspiration was confirmed to be negative for CSF and/or blood. A 1-2 ml. volume of Isovue-250 was injected and flow of contrast was noted at each level. Radiographs were obtained for documentation purposes.   After attaining the desired flow of contrast documented above, a 0.5 to 1.0 ml test dose of 0.25% Marcaine was  injected into each respective transforaminal space.  The patient was observed for 90 seconds post injection.  After no sensory deficits were reported, and normal lower extremity motor function was noted,   the above injectate was administered so that equal amounts of the injectate were placed at each foramen (level) into the transforaminal epidural space.   Additional Comments:  The patient tolerated the procedure well Dressing: Band-Aid with 2 x 2 sterile gauze    Post-procedure details: Patient was observed during the procedure. Post-procedure instructions were reviewed.  Patient left the clinic in stable condition.

## 2021-05-14 NOTE — Progress Notes (Signed)
Cheyenne Perez - 75 y.o. female MRN [id number]  Date of birth: Apr 29, 1946  Office Visit Note: Visit Date: 05/12/2021 PCP: No primary care provider on file. Referred by: Valeria Batman, MD  Subjective: Chief Complaint  Patient presents with   Lower Back - Pain   Left Knee - Pain   Left Ankle - Pain   HPI:  Cheyenne Perez is a 74 y.o. female who comes in today at the request of Dr. Norlene Campbell for planned Left S1-2 Lumbar Transforaminal epidural steroid injection with fluoroscopic guidance.  The patient has failed conservative care including home exercise, medications, time and activity modification.  This injection will be diagnostic and hopefully therapeutic.  Please see requesting physician notes for further details and justification. MRI reviewed with images and spine model.  MRI reviewed in the note below.  Patient has MRI findings of degenerative listhesis and facet arthropathy at L5-S1 with left-sided in particular facet arthropathy with left lateral recess impact from either disc fragment or facet spurring.  She has symptoms consistent with an S1 radiculopathy.  Depending on relief with the injections she may in fact need surgical consultation.  We did discuss that at length today.  Her case is complicated by anticoagulation on Xarelto.     ROS Otherwise per HPI.  Assessment & Plan: Visit Diagnoses:    ICD-10-CM   1. Lumbar radiculopathy  M54.16 XR C-ARM NO REPORT    Epidural Steroid injection    methylPREDNISolone acetate (DEPO-MEDROL) injection 80 mg      Plan: No additional findings.   Meds & Orders:  Meds ordered this encounter  Medications   methylPREDNISolone acetate (DEPO-MEDROL) injection 80 mg    Orders Placed This Encounter  Procedures   XR C-ARM NO REPORT   Epidural Steroid injection    Follow-up: Return if symptoms worsen or fail to improve.   Procedures: No procedures performed  S1 Lumbosacral Transforaminal Epidural Steroid Injection -  Sub-Pedicular Approach with Fluoroscopic Guidance   Patient: Cheyenne Perez      Date of Birth: 01/26/1946 MRN: [id number] PCP: No primary care provider on file.      Visit Date: 05/12/2021   Universal Protocol:    Date/Time: 07/20/226:03 AM  Consent Given By: the patient  Position:  PRONE  Additional Comments: Vital signs were monitored before and after the procedure. Patient was prepped and draped in the usual sterile fashion. The correct patient, procedure, and site was verified.   Injection Procedure Details:  Procedure Site One Meds Administered:  Meds ordered this encounter  Medications   methylPREDNISolone acetate (DEPO-MEDROL) injection 80 mg    Laterality: Left  Location/Site:  S1 Foramen   Needle size: 22 ga.  Needle type: Spinal  Needle Placement: Transforaminal  Findings:   -Comments: Excellent flow of contrast along the nerve, nerve root and into the epidural space.  Epidurogram: Contrast epidurogram showed no nerve root cut off or restricted flow pattern.  Procedure Details: After squaring off the sacral end-plate to get a true AP view, the C-arm was positioned so that the best possible view of the S1 foramen was visualized. The soft tissues overlying this structure were infiltrated with 2-3 ml. of 1% Lidocaine without Epinephrine.    The spinal needle was inserted toward the target using a "trajectory" view along the fluoroscope beam.  Under AP and lateral visualization, the needle was advanced so it did not puncture dura. Biplanar projections were used to confirm position. Aspiration was confirmed to be  negative for CSF and/or blood. A 1-2 ml. volume of Isovue-250 was injected and flow of contrast was noted at each level. Radiographs were obtained for documentation purposes.   After attaining the desired flow of contrast documented above, a 0.5 to 1.0 ml test dose of 0.25% Marcaine was injected into each respective transforaminal space.  The patient  was observed for 90 seconds post injection.  After no sensory deficits were reported, and normal lower extremity motor function was noted,   the above injectate was administered so that equal amounts of the injectate were placed at each foramen (level) into the transforaminal epidural space.   Additional Comments:  The patient tolerated the procedure well Dressing: Band-Aid with 2 x 2 sterile gauze    Post-procedure details: Patient was observed during the procedure. Post-procedure instructions were reviewed.  Patient left the clinic in stable condition.   Clinical History: MRI LUMBAR SPINE WITHOUT CONTRAST   TECHNIQUE:  Multiplanar, multisequence MR imaging of the lumbar spine was  performed. No intravenous contrast was administered.   COMPARISON:  Lumbar spine radiograph 03/28/2021   FINDINGS:  Segmentation:  Standard   Alignment: Mild retrolisthesis L1-2 and L2-3. 5 mm anterolisthesis  L5-S1.   Vertebrae: Negative for fracture or mass. Mild dorsal sacral edema  on the left is felt to be related to advanced facet degeneration on  the left at L5-S1. Scattered small hemangiomata.   Conus medullaris and cauda equina: Conus extends to the L1-2 level.  Conus and cauda equina appear normal.   Paraspinal and other soft tissues: Negative for paraspinous mass or  adenopathy.   Disc levels:   T12-L1: Small central disc protrusion.  Negative for stenosis   L1-2: Small central disc protrusion with small extruded disc  fragment in the midline extending cranially. Mild facet  degeneration. Mild subarticular stenosis bilaterally   L2-3: Small central disc protrusion with upgoing disc material. Mild  facet degeneration. Mild subarticular stenosis bilaterally.   L3-4: Broad-based disc bulging with small central disc protrusion.  Mild facet degeneration. Mild subarticular stenosis bilaterally   L4-5: Diffuse bulging of the disc with associated mild endplate  spurring. Bilateral  facet hypertrophy. Moderate subarticular and  foraminal stenosis bilaterally. Spinal canal adequate in size   L5-S1: 5 mm anterolisthesis. Severe facet degeneration, left greater  than right. Marked facet bony hypertrophy on the left. In addition,  there is significant soft tissue thickening in the left lateral  epidural space which appears to be arising from the facet joint.  This extends caudally and is causing compression of the left S1  nerve root. Suspect complex synovial cyst versus disc fragment or  ligament hypertrophy. Correlate with left S1 nerve root symptoms. In  addition, in addition, there is moderate left foraminal stenosis and  mild right foraminal stenosis.   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   There is significant left S1 nerve root impingement which appears to  be due to advanced facet degeneration on the left as well as  significant epidural soft tissue thickening compressing the left S1  nerve root. This may be a complex synovial cyst versus disc fragment  or ligament hypertrophy related to facet degeneration.    Electronically Signed    By: Cheyenne Perez M.D.    On: 04/23/2021 07:37     Objective:  VS:  HT:    WT:   BMI:     BP:137/74  HR:91bpm  TEMP: ( )  RESP:  Physical Exam Vitals and  nursing note reviewed.  Constitutional:      General: She is not in acute distress.    Appearance: Normal appearance. She is not ill-appearing.  HENT:     Head: Normocephalic and atraumatic.     Right Ear: External ear normal.     Left Ear: External ear normal.  Eyes:     Extraocular Movements: Extraocular movements intact.  Cardiovascular:     Rate and Rhythm: Normal rate.     Pulses: Normal pulses.  Pulmonary:     Effort: Pulmonary effort is normal. No respiratory distress.  Abdominal:     General: There is no distension.     Palpations: Abdomen is soft.  Musculoskeletal:        General: Tenderness present.      Cervical back: Neck supple.     Right lower leg: No edema.     Left lower leg: No edema.     Comments: Patient has good distal strength with no pain over the greater trochanters.  No clonus or focal weakness.  Appears to maybe have mild weakness with plantar flexion.  She is not walking with any foot drop or hip hiking etc.  She is ambulating with a cane however.  Skin:    Findings: No erythema, lesion or rash.  Neurological:     General: No focal deficit present.     Mental Status: She is alert and oriented to person, place, and time.     Sensory: No sensory deficit.     Motor: No weakness or abnormal muscle tone.     Coordination: Coordination normal.  Psychiatric:        Mood and Affect: Mood normal.        Behavior: Behavior normal.     Imaging: No results found.

## 2021-05-28 MED ORDER — TRAMADOL HCL 50 MG PO TABS: 50.0000 mg | ORAL_TABLET | Freq: Two times a day (BID) | ORAL | 0 refills | Status: AC | PRN

## 2021-05-29 DIAGNOSIS — H353122 Nonexudative age-related macular degeneration, left eye, intermediate dry stage: Secondary | ICD-10-CM | POA: Diagnosis not present

## 2021-07-01 DIAGNOSIS — M79671 Pain in right foot: Secondary | ICD-10-CM | POA: Diagnosis not present

## 2021-07-01 DIAGNOSIS — S92324A Nondisplaced fracture of second metatarsal bone, right foot, initial encounter for closed fracture: Secondary | ICD-10-CM | POA: Diagnosis not present

## 2021-07-01 DIAGNOSIS — M25579 Pain in unspecified ankle and joints of unspecified foot: Secondary | ICD-10-CM | POA: Diagnosis not present

## 2021-07-02 DIAGNOSIS — E1169 Type 2 diabetes mellitus with other specified complication: Secondary | ICD-10-CM | POA: Diagnosis not present

## 2021-07-02 DIAGNOSIS — L0390 Cellulitis, unspecified: Secondary | ICD-10-CM | POA: Diagnosis not present

## 2021-07-02 DIAGNOSIS — I10 Essential (primary) hypertension: Secondary | ICD-10-CM | POA: Diagnosis not present

## 2021-07-02 DIAGNOSIS — E7849 Other hyperlipidemia: Secondary | ICD-10-CM | POA: Diagnosis not present

## 2021-07-02 DIAGNOSIS — Z0000 Encounter for general adult medical examination without abnormal findings: Secondary | ICD-10-CM | POA: Diagnosis not present

## 2021-07-04 ENCOUNTER — Encounter (INDEPENDENT_AMBULATORY_CARE_PROVIDER_SITE_OTHER): Payer: Self-pay | Admitting: *Deleted

## 2021-07-22 DIAGNOSIS — S92324A Nondisplaced fracture of second metatarsal bone, right foot, initial encounter for closed fracture: Secondary | ICD-10-CM | POA: Diagnosis not present

## 2021-07-22 DIAGNOSIS — M79674 Pain in right toe(s): Secondary | ICD-10-CM | POA: Diagnosis not present

## 2021-07-22 DIAGNOSIS — M79671 Pain in right foot: Secondary | ICD-10-CM | POA: Diagnosis not present

## 2021-08-06 DIAGNOSIS — M19071 Primary osteoarthritis, right ankle and foot: Secondary | ICD-10-CM | POA: Diagnosis not present

## 2021-08-14 DIAGNOSIS — M79674 Pain in right toe(s): Secondary | ICD-10-CM | POA: Diagnosis not present

## 2021-08-14 DIAGNOSIS — S92324A Nondisplaced fracture of second metatarsal bone, right foot, initial encounter for closed fracture: Secondary | ICD-10-CM | POA: Diagnosis not present

## 2021-08-14 DIAGNOSIS — M79671 Pain in right foot: Secondary | ICD-10-CM | POA: Diagnosis not present

## 2021-09-04 DIAGNOSIS — S92324A Nondisplaced fracture of second metatarsal bone, right foot, initial encounter for closed fracture: Secondary | ICD-10-CM | POA: Diagnosis not present

## 2021-09-04 DIAGNOSIS — M79671 Pain in right foot: Secondary | ICD-10-CM | POA: Diagnosis not present

## 2021-09-04 DIAGNOSIS — M79674 Pain in right toe(s): Secondary | ICD-10-CM | POA: Diagnosis not present

## 2021-12-02 ENCOUNTER — Encounter (INDEPENDENT_AMBULATORY_CARE_PROVIDER_SITE_OTHER): Payer: Self-pay | Admitting: *Deleted

## 2021-12-02 ENCOUNTER — Telehealth (INDEPENDENT_AMBULATORY_CARE_PROVIDER_SITE_OTHER): Payer: Self-pay

## 2021-12-02 ENCOUNTER — Other Ambulatory Visit (INDEPENDENT_AMBULATORY_CARE_PROVIDER_SITE_OTHER): Payer: Self-pay

## 2021-12-02 ENCOUNTER — Encounter (INDEPENDENT_AMBULATORY_CARE_PROVIDER_SITE_OTHER): Payer: Self-pay

## 2021-12-02 DIAGNOSIS — Z1211 Encounter for screening for malignant neoplasm of colon: Secondary | ICD-10-CM

## 2021-12-02 MED ORDER — PEG 3350-KCL-NA BICARB-NACL 420 G PO SOLR: 4000.0000 mL | ORAL | 0 refills | Status: DC

## 2021-12-02 NOTE — Telephone Encounter (Signed)
Ok to schedule.  Thanks,  Jeramey Lanuza Castaneda Mayorga, MD Gastroenterology and Hepatology [facility]  

## 2021-12-02 NOTE — Telephone Encounter (Signed)
Referring MD/PCP: Hasanaj  Procedure: Tcs  Reason/Indication:  Screening  Has patient had this procedure before?  no  If so, when, by whom and where?    Is there a family history of colon cancer?  no  Who?  What age when diagnosed?    Is patient diabetic? If yes, Type 1 or Type 2   no      Does patient have prosthetic heart valve or mechanical valve?  no  Do you have a pacemaker/defibrillator?  no  Has patient ever had endocarditis/atrial fibrillation? no  Does patient use oxygen? no  Has patient had joint replacement within last 12 months?  no  Is patient constipated or do they take laxatives? no  Does patient have a history of alcohol/drug use?  no  Have you had a stroke/heart attack last 6 mths? no  Do you take medicine for weight loss?  no  For female patients,: have you had a hysterectomy no                      are you post menopausal yes                      do you still have your menstrual cycle No  Is patient on blood thinner such as Coumadin, Plavix and/or Aspirin? no  Medications: ropinorole 3 mg daily, tizanidine 4 mg daily, preservision daily, MVI daily, atorvastatin 40 mg daily, benazepril prn, metformin (dont take on a daily basis )  Allergies: nkda  Medication Adjustment pe Dr Levon Hedger no metformin the evening prior or the morning of procedure  Procedure date & time: 12/19/21 at 830 am

## 2021-12-02 NOTE — Telephone Encounter (Signed)
Derrien Anschutz Ann Laterrian Hevener, CMA  ?

## 2021-12-03 ENCOUNTER — Encounter (INDEPENDENT_AMBULATORY_CARE_PROVIDER_SITE_OTHER): Payer: Self-pay

## 2021-12-12 NOTE — Patient Instructions (Signed)
Cheyenne Perez  12/12/2021     @PREFPERIOPPHARMACY @   Your procedure is scheduled on  12/19/2021.   Report to 12/21/2021 at  0700 A.M.   Call this number if you have problems the morning of surgery:  [phone]   Remember:  Follow the diet and prep instructions given to you by the office.       Take these medicines the morning of surgery with A SIP OF WATER                           tramadol(if needed).     Do not wear jewelry, make-up or nail polish.  Do not wear lotions, powders, or perfumes, or deodorant.  Do not shave 48 hours prior to surgery.  Men may shave face and neck.  Do not bring valuables to the hospital.  [facility] is not responsible for any belongings or valuables.  Contacts, dentures or bridgework may not be worn into surgery.  Leave your suitcase in the car.  After surgery it may be brought to your room.  For patients admitted to the hospital, discharge time will be determined by your treatment team.  Patients discharged the day of surgery will not be allowed to drive home and must have someone with them for 24 hours.    Special instructions:   DO NOT smoke tobacco or vape for 24 hours before your procedure.  Please read over the following fact sheets that you were given. Anesthesia Post-op Instructions and Care and Recovery After Surgery      Colonoscopy, Adult, Care After This sheet gives you information about how to care for yourself after your procedure. Your health care provider may also give you more specific instructions. If you have problems or questions, contact your health care provider. What can I expect after the procedure? After the procedure, it is common to have: A small amount of blood in your stool for 24 hours after the procedure. Some gas. Mild cramping or bloating of your abdomen. Follow these instructions at home: Eating and drinking  Drink enough fluid to keep your urine pale yellow. Follow instructions from  your health care provider about eating or drinking restrictions. Resume your normal diet as instructed by your health care provider. Avoid heavy or fried foods that are hard to digest. Activity Rest as told by your health care provider. Avoid sitting for a long time without moving. Get up to take short walks every 1-2 hours. This is important to improve blood flow and breathing. Ask for help if you feel weak or unsteady. Return to your normal activities as told by your health care provider. Ask your health care provider what activities are safe for you. Managing cramping and bloating  Try walking around when you have cramps or feel bloated. Apply heat to your abdomen as told by your health care provider. Use the heat source that your health care provider recommends, such as a moist heat pack or a heating pad. Place a towel between your skin and the heat source. Leave the heat on for 20-30 minutes. Remove the heat if your skin turns bright red. This is especially important if you are unable to feel pain, heat, or cold. You may have a greater risk of getting burned. General instructions If you were given a sedative during the procedure, it can affect you for several hours. Do not drive or operate machinery until your  health care provider says that it is safe. For the first 24 hours after the procedure: Do not sign important documents. Do not drink alcohol. Do your regular daily activities at a slower pace than normal. Eat soft foods that are easy to digest. Take over-the-counter and prescription medicines only as told by your health care provider. Keep all follow-up visits as told by your health care provider. This is important. Contact a health care provider if: You have blood in your stool 2-3 days after the procedure. Get help right away if you have: More than a small spotting of blood in your stool. Large blood clots in your stool. Swelling of your abdomen. Nausea or vomiting. A  fever. Increasing pain in your abdomen that is not relieved with medicine. Summary After the procedure, it is common to have a small amount of blood in your stool. You may also have mild cramping and bloating of your abdomen. If you were given a sedative during the procedure, it can affect you for several hours. Do not drive or operate machinery until your health care provider says that it is safe. Get help right away if you have a lot of blood in your stool, nausea or vomiting, a fever, or increased pain in your abdomen. This information is not intended to replace advice given to you by your health care provider. Make sure you discuss any questions you have with your health care provider. Document Revised: 08/18/2019 Document Reviewed: 05/08/2019 Elsevier Patient Education  [place] After This sheet gives you information about how to care for yourself after your procedure. Your health care provider may also give you more specific instructions. If you have problems or questions, contact your health care provider. What can I expect after the procedure? After the procedure, it is common to have: Tiredness. Forgetfulness about what happened after the procedure. Impaired judgment for important decisions. Nausea or vomiting. Some difficulty with balance. Follow these instructions at home: For the time period you were told by your health care provider:   Rest as needed. Do not participate in activities where you could fall or become injured. Do not drive or use machinery. Do not drink alcohol. Do not take sleeping pills or medicines that cause drowsiness. Do not make important decisions or sign legal documents. Do not take care of children on your own. Eating and drinking Follow the diet that is recommended by your health care provider. Drink enough fluid to keep your urine pale yellow. If you vomit: Drink water, juice, or soup when you can drink  without vomiting. Make sure you have little or no nausea before eating solid foods. General instructions Have a responsible adult stay with you for the time you are told. It is important to have someone help care for you until you are awake and alert. Take over-the-counter and prescription medicines only as told by your health care provider. If you have sleep apnea, surgery and certain medicines can increase your risk for breathing problems. Follow instructions from your health care provider about wearing your sleep device: Anytime you are sleeping, including during daytime naps. While taking prescription pain medicines, sleeping medicines, or medicines that make you drowsy. Avoid smoking. Keep all follow-up visits as told by your health care provider. This is important. Contact a health care provider if: You keep feeling nauseous or you keep vomiting. You feel light-headed. You are still sleepy or having trouble with balance after 24 hours. You develop a rash. You  have a fever. You have redness or swelling around the IV site. Get help right away if: You have trouble breathing. You have new-onset confusion at home. Summary For several hours after your procedure, you may feel tired. You may also be forgetful and have poor judgment. Have a responsible adult stay with you for the time you are told. It is important to have someone help care for you until you are awake and alert. Rest as told. Do not drive or operate machinery. Do not drink alcohol or take sleeping pills. Get help right away if you have trouble breathing, or if you suddenly become confused. This information is not intended to replace advice given to you by your health care provider. Make sure you discuss any questions you have with your health care provider. Document Revised: 06/27/2020 Document Reviewed: 09/14/2019 Elsevier Patient Education  2022 [employer].

## 2021-12-15 ENCOUNTER — Encounter (HOSPITAL_COMMUNITY)
Admission: RE | Admit: 2021-12-15 | Discharge: 2021-12-15 | Disposition: A | Discharge status: Home / Self Care | Payer: Medicare Other | Source: Ambulatory Visit | Attending: Gastroenterology | Admitting: Gastroenterology

## 2021-12-19 ENCOUNTER — Ambulatory Visit (HOSPITAL_COMMUNITY): Payer: 59 | Admitting: Anesthesiology

## 2021-12-19 ENCOUNTER — Ambulatory Visit (HOSPITAL_BASED_OUTPATIENT_CLINIC_OR_DEPARTMENT_OTHER): Payer: 59 | Admitting: Anesthesiology

## 2021-12-19 ENCOUNTER — Encounter (HOSPITAL_COMMUNITY)
Admission: RE | Disposition: A | Discharge status: Home / Self Care | Payer: Self-pay | Source: Home / Self Care | Attending: Gastroenterology

## 2021-12-19 ENCOUNTER — Encounter (HOSPITAL_COMMUNITY): Payer: Self-pay | Admitting: Gastroenterology

## 2021-12-19 ENCOUNTER — Ambulatory Visit (HOSPITAL_COMMUNITY)
Admission: RE | Admit: 2021-12-19 | Discharge: 2021-12-19 | Disposition: A | Discharge status: Home / Self Care | Payer: 59 | Attending: Gastroenterology | Admitting: Gastroenterology

## 2021-12-19 DIAGNOSIS — K648 Other hemorrhoids: Secondary | ICD-10-CM

## 2021-12-19 DIAGNOSIS — Z1211 Encounter for screening for malignant neoplasm of colon: Secondary | ICD-10-CM

## 2021-12-19 DIAGNOSIS — G2581 Restless legs syndrome: Secondary | ICD-10-CM | POA: Insufficient documentation

## 2021-12-19 DIAGNOSIS — K644 Residual hemorrhoidal skin tags: Secondary | ICD-10-CM | POA: Diagnosis not present

## 2021-12-19 DIAGNOSIS — K5730 Diverticulosis of large intestine without perforation or abscess without bleeding: Secondary | ICD-10-CM | POA: Diagnosis not present

## 2021-12-19 DIAGNOSIS — E1140 Type 2 diabetes mellitus with diabetic neuropathy, unspecified: Secondary | ICD-10-CM | POA: Diagnosis not present

## 2021-12-19 DIAGNOSIS — K635 Polyp of colon: Secondary | ICD-10-CM | POA: Diagnosis not present

## 2021-12-19 HISTORY — PX: COLONOSCOPY WITH PROPOFOL: SHX5780

## 2021-12-19 HISTORY — PX: POLYPECTOMY: SHX5525

## 2021-12-19 SURGERY — COLONOSCOPY WITH PROPOFOL
Anesthesia: General

## 2021-12-19 MED ORDER — PROPOFOL 10 MG/ML IV BOLUS
INTRAVENOUS | Status: DC | PRN
  Administered 2021-12-19: 60 mg via INTRAVENOUS

## 2021-12-19 MED ORDER — PROPOFOL 500 MG/50ML IV EMUL
INTRAVENOUS | Status: DC | PRN
  Administered 2021-12-19: 150 ug/kg/min via INTRAVENOUS

## 2021-12-19 MED ORDER — PROPOFOL 500 MG/50ML IV EMUL
INTRAVENOUS | Status: AC
  Filled 2021-12-19: qty 50

## 2021-12-19 MED ORDER — STERILE WATER FOR IRRIGATION IR SOLN
Status: DC | PRN
  Administered 2021-12-19: 120 mL

## 2021-12-19 MED ORDER — LACTATED RINGERS IV SOLN: INTRAVENOUS | Status: DC

## 2021-12-19 NOTE — H&P (Signed)
Cheyenne Perez is an 76 y.o. female.   Chief Complaint: Screening colonoscopy HPI: 76 year old female with past medical history of diabetes and restless leg syndrome, coming for screening colonoscopy. The patient has never had a colonoscopy in the past.  The patient denies having any complaints such as melena, hematochezia, abdominal pain or distention, change in her bowel movement consistency or frequency, no changes in her weight recently.  No family history of colorectal cancer.   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 She does not have any smokeless tobacco history on file. She reports that she does not drink alcohol and does not use drugs.  Allergies: No Known Allergies  Medications Prior to Admission  Medication Sig Dispense Refill   Aspirin-Caffeine (BC FAST PAIN RELIEF ARTHRITIS PO) Take 1 packet by mouth daily as needed (pain).     atorvastatin (LIPITOR) 40 MG tablet Take 40 mg by mouth every Monday, Wednesday, and Friday.     Multiple Vitamin (MULTIVITAMIN WITH MINERALS) TABS tablet Take 1 tablet by mouth daily.     naproxen sodium (ALEVE) 220 MG tablet Take 220 mg by mouth daily as needed (pain).     rOPINIRole (REQUIP) 3 MG  tablet Take 3 mg by mouth at bedtime.     tiZANidine (ZANAFLEX) 4 MG tablet Take 4 mg by mouth at bedtime.     traMADol (ULTRAM) 50 MG tablet Take 1 tablet (50 mg total) by mouth every 12 (twelve) hours as needed. (Patient taking differently: Take 50 mg by mouth every 12 (twelve) hours as needed for severe pain.) 30 tablet 0   polyethylene glycol-electrolytes (TRILYTE) 420 g solution Take 4,000 mLs by mouth as directed. 4000 mL 0    No results found for this or any previous visit (from the past 48 hour(s)). No results found.  Review of Systems  Constitutional: Negative.   HENT: Negative.    Eyes: Negative.   Respiratory: Negative.    Cardiovascular: Negative.   Gastrointestinal: Negative.   Endocrine: Negative.   Genitourinary: Negative.   Musculoskeletal: Negative.   Skin: Negative.   Allergic/Immunologic: Negative.   Neurological: Negative.   Hematological: Negative.   Psychiatric/Behavioral: Negative.     Blood pressure (!) 182/84, pulse 78, temperature 98.2 F (36.8 C), resp. rate 17, SpO2 97 %. Physical Exam  GENERAL: The patient is AO x3, in no acute distress. HEENT: Head is normocephalic and atraumatic. EOMI are intact. Mouth is well hydrated and without lesions. NECK: Supple. No masses LUNGS: Clear to auscultation. No presence of rhonchi/wheezing/rales. Adequate  chest expansion HEART: RRR, normal s1 and s2. ABDOMEN: Soft, nontender, no guarding, no peritoneal signs, and nondistended. BS +. No masses. EXTREMITIES: Without any cyanosis, clubbing, rash, lesions or edema. NEUROLOGIC: AOx3, no focal motor deficit. SKIN: no jaundice, no rashes  Assessment/Plan 76 year old female with past medical history of diabetes and restless leg syndrome, coming for screening colonoscopy. The patient is at average risk for colorectal cancer.  We will proceed with colonoscopy today.   Dolores Frame, MD 12/19/2021, 8:04 AM

## 2021-12-19 NOTE — OR Nursing (Signed)
Attempted to call patient because she did not show up for her procedure as scheduled. No answer. Left message for her to call hospital [phone].   Also attempted to call the friend who was listed as her ride. The friend said " she got mad at me over a year ago and they have not spoke to each other."  Hopefully she will call back to let us know if she is still having her procedure.

## 2021-12-19 NOTE — Discharge Instructions (Signed)
You are being discharged to home.  Resume your previous diet.  We are waiting for your pathology results.  Your physician has recommended a repeat colonoscopy (date to be determined after pending pathology results are reviewed) for screening purposes.  

## 2021-12-19 NOTE — Anesthesia Preprocedure Evaluation (Signed)
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) negative psych ROS   GI/Hepatic negative GI ROS, Neg liver ROS,   Endo/Other  negative endocrine ROSdiabetes, Well Controlled, Type 2  Renal/GU negative Renal ROS  negative genitourinary   Musculoskeletal  (+) Arthritis ,   Abdominal   Peds negative pediatric ROS (+)  Hematology  (+) Blood dyscrasia, anemia ,   Anesthesia Other Findings   Reproductive/Obstetrics negative OB ROS                            Anesthesia Physical Anesthesia Plan  ASA: 2  Anesthesia Plan: General   Post-op Pain Management: Minimal or no pain anticipated   Induction: Intravenous  PONV Risk Score and Plan: TIVA  Airway Management Planned: Nasal Cannula and Natural Airway  Additional Equipment:   Intra-op Plan:   Post-operative Plan:   Informed Consent: I have reviewed the patients History and Physical, chart, labs and discussed the procedure including the risks, benefits and alternatives for the proposed anesthesia with the patient or authorized representative who has indicated his/her understanding and acceptance.       Plan Discussed with: CRNA and Surgeon  Anesthesia Plan Comments:        Anesthesia Quick Evaluation

## 2021-12-19 NOTE — Op Note (Signed)
[facility] Patient Name: Cheyenne Perez Procedure Date: 12/19/2021 7:57 AM MRN: [id number] Date of Birth: July 08, 1946 Attending MD: Maylon Peppers ,  CSN: [id number] Age: 76 Admit Type: Outpatient Procedure:                Colonoscopy Indications:              Screening for colorectal malignant neoplasm Providers:                Maylon Peppers, Hughie Closs RN, RN, Tammy                            Vaught, RN, Lurline Del, RN Referring MD:              Medicines:                Monitored Anesthesia Care Complications:            No immediate complications. Estimated Blood Loss:     Estimated blood loss: none. Procedure:                Pre-Anesthesia Assessment:                           - Prior to the procedure, a History and Physical                            was performed, and patient medications, allergies                            and sensitivities were reviewed. The patient's                            tolerance of previous anesthesia was reviewed.                           - The risks and benefits of the procedure and the                            sedation options and risks were discussed with the                            patient. All questions were answered and informed                            consent was obtained.                           - ASA Grade Assessment: II - A patient with mild                            systemic disease.                           After obtaining informed consent, the colonoscope                            was passed under direct vision. Throughout the  procedure, the patient's blood pressure, pulse, and                            oxygen saturations were monitored continuously. The                            [id number]-[id number] scope was introduced through                            the anus and advanced to the the cecum, identified                            by appendiceal orifice and ileocecal valve. The                             colonoscopy was performed without difficulty. The                            patient tolerated the procedure well. The quality                            of the bowel preparation was adequate. Scope In: 8:09:01 AM Scope Out: 8:36:11 AM Scope Withdrawal Time: 0 hours 23 minutes 38 seconds  Total Procedure Duration: 0 hours 27 minutes 10 seconds  Findings:      Hemorrhoids were found on perianal exam.      A few small-mouthed diverticula were found in the sigmoid colon.      A 2 mm polyp was found in the sigmoid colon. The polyp was sessile. The       polyp was removed with a cold biopsy forceps. Resection and retrieval       were complete.      Non-bleeding external internal hemorrhoids were found during       retroflexion. The hemorrhoids were small. Impression:               - Hemorrhoids found on perianal exam.                           - Diverticulosis in the sigmoid colon.                           - One 2 mm polyp in the sigmoid colon, removed with                            a cold biopsy forceps. Resected and retrieved.                           - Non-bleeding external internal hemorrhoids. Moderate Sedation:      Per Anesthesia Care Recommendation:           - Discharge patient to home (ambulatory).                           - Resume previous diet.                           -  Await pathology results.                           - Repeat colonoscopy date to be determined after                            pending pathology results are reviewed for                            screening purposes. Procedure Code(s):        --- Professional ---                           [phone], Colonoscopy, flexible; with biopsy, single                            or multiple Diagnosis Code(s):        --- Professional ---                           Z12.11, Encounter for screening for malignant                            neoplasm of colon                           K64.4, Residual  hemorrhoidal skin tags                           K64.8, Other hemorrhoids                           K63.5, Polyp of colon                           K57.30, Diverticulosis of large intestine without                            perforation or abscess without bleeding CPT copyright 2019 American Medical Association. All rights reserved. The codes documented in this report are preliminary and upon coder review may  be revised to meet current compliance requirements. Maylon Peppers, MD Maylon Peppers,  12/19/2021 8:43:01 AM This report has been signed electronically. Number of Addenda: 0

## 2021-12-19 NOTE — Transfer of Care (Signed)
Immediate Anesthesia Transfer of Care Note  Patient: Cheyenne Perez  Procedure(s) Performed: COLONOSCOPY WITH PROPOFOL POLYPECTOMY  Patient Location: PACU  Anesthesia Type:General  Level of Consciousness: awake, pateint uncooperative and confused  Airway & Oxygen Therapy: Patient Spontanous Breathing and Patient connected to nasal cannula oxygen  Post-op Assessment: Report given to RN, Post -op Vital signs reviewed and stable and Patient moving all extremities X 4  Post vital signs: Reviewed  Last Vitals:  Vitals Value Taken Time  BP 114/96   Temp 97.0   Pulse 82   Resp 15   SpO2 100     Last Pain:  Vitals:   12/19/21 0805  PainSc: 0-No pain         Complications: No notable events documented.

## 2021-12-19 NOTE — Anesthesia Postprocedure Evaluation (Signed)
Anesthesia Post Note  Patient: Cheyenne Perez  Procedure(s) Performed: COLONOSCOPY WITH PROPOFOL POLYPECTOMY  Patient location during evaluation: PACU Anesthesia Type: General Level of consciousness: awake and alert and oriented Pain management: pain level controlled Vital Signs Assessment: post-procedure vital signs reviewed and stable Respiratory status: spontaneous breathing, nonlabored ventilation and respiratory function stable Cardiovascular status: blood pressure returned to baseline and stable Postop Assessment: no apparent nausea or vomiting Anesthetic complications: no   No notable events documented.   Last Vitals:  Vitals:   12/19/21 0745 12/19/21 0841  BP: (!) 182/84 (!) 114/96  Pulse: 78 88  Resp: 17 (!) 22  Temp: 36.8 C 36.8 C  SpO2: 97% 99%    Last Pain:  Vitals:   12/19/21 0841  TempSrc: Oral  PainSc: 0-No pain                 Katelyn Broadnax C [facility]

## 2021-12-23 LAB — SURGICAL PATHOLOGY

## 2021-12-24 ENCOUNTER — Encounter (HOSPITAL_COMMUNITY): Payer: Self-pay | Admitting: Gastroenterology

## 2022-04-07 ENCOUNTER — Other Ambulatory Visit: Payer: Self-pay | Admitting: Orthopaedic Surgery

## 2022-04-07 DIAGNOSIS — M79605 Pain in left leg: Secondary | ICD-10-CM

## 2022-10-29 DIAGNOSIS — E7849 Other hyperlipidemia: Secondary | ICD-10-CM | POA: Diagnosis not present

## 2022-10-29 DIAGNOSIS — E1169 Type 2 diabetes mellitus with other specified complication: Secondary | ICD-10-CM | POA: Diagnosis not present

## 2022-10-29 DIAGNOSIS — I10 Essential (primary) hypertension: Secondary | ICD-10-CM | POA: Diagnosis not present

## 2022-10-29 DIAGNOSIS — Z6824 Body mass index (BMI) 24.0-24.9, adult: Secondary | ICD-10-CM | POA: Diagnosis not present

## 2022-12-11 DIAGNOSIS — H43813 Vitreous degeneration, bilateral: Secondary | ICD-10-CM | POA: Diagnosis not present

## 2022-12-31 DIAGNOSIS — M79671 Pain in right foot: Secondary | ICD-10-CM | POA: Diagnosis not present

## 2022-12-31 DIAGNOSIS — M2041 Other hammer toe(s) (acquired), right foot: Secondary | ICD-10-CM | POA: Diagnosis not present

## 2022-12-31 DIAGNOSIS — M79672 Pain in left foot: Secondary | ICD-10-CM | POA: Diagnosis not present

## 2022-12-31 DIAGNOSIS — M722 Plantar fascial fibromatosis: Secondary | ICD-10-CM | POA: Diagnosis not present

## 2023-01-28 DIAGNOSIS — E7849 Other hyperlipidemia: Secondary | ICD-10-CM | POA: Diagnosis not present

## 2023-01-28 DIAGNOSIS — I10 Essential (primary) hypertension: Secondary | ICD-10-CM | POA: Diagnosis not present

## 2023-01-28 DIAGNOSIS — E1169 Type 2 diabetes mellitus with other specified complication: Secondary | ICD-10-CM | POA: Diagnosis not present

## 2023-01-28 DIAGNOSIS — Z6826 Body mass index (BMI) 26.0-26.9, adult: Secondary | ICD-10-CM | POA: Diagnosis not present

## 2023-01-29 DIAGNOSIS — E1169 Type 2 diabetes mellitus with other specified complication: Secondary | ICD-10-CM | POA: Diagnosis not present

## 2023-01-29 DIAGNOSIS — E7849 Other hyperlipidemia: Secondary | ICD-10-CM | POA: Diagnosis not present

## 2023-01-29 DIAGNOSIS — I10 Essential (primary) hypertension: Secondary | ICD-10-CM | POA: Diagnosis not present

## 2024-10-11 ENCOUNTER — Ambulatory Visit (INDEPENDENT_AMBULATORY_CARE_PROVIDER_SITE_OTHER)

## 2024-10-11 ENCOUNTER — Encounter: Payer: Self-pay | Admitting: Podiatry

## 2024-10-11 ENCOUNTER — Ambulatory Visit: Admitting: Podiatry

## 2024-10-11 DIAGNOSIS — M2041 Other hammer toe(s) (acquired), right foot: Secondary | ICD-10-CM | POA: Diagnosis not present

## 2024-10-11 DIAGNOSIS — M21612 Bunion of left foot: Secondary | ICD-10-CM

## 2024-10-12 NOTE — Progress Notes (Signed)
 Subjective:   Patient ID: Cheyenne Perez, female   DOB: 78 y.o.   MRN: [id number]   HPI Patient presents stating that she has painful bunion deformity left and on the right has a hammertoe second digit.  She also does have diabetes that is under reasonably good control but she is trying to use diet to get it controlled completely and states she thinks her A1c is around 7.5-8.  States that it is been very sore around the big toe joint and the second toe and she has trouble wearing shoe gear comfortably and has tried wider shoes soaks without relief of symptoms.  Patient does not smoke likes to be active   Review of Systems  All other systems reviewed and are negative.       Objective:  Physical Exam Vitals and nursing note reviewed.  Constitutional:      Appearance: She is well-developed.  Pulmonary:     Effort: Pulmonary effort is normal.  Musculoskeletal:        General: Normal range of motion.  Skin:    General: Skin is warm.  Neurological:     Mental Status: She is alert.     Neurovascular status intact muscle strength found to be adequate range of motion within normal limits.  Patient does have hyperostosis medial aspect first metatarsal head left with redness around the joint surface and a rigidly contracted second toe right foot with pain dorsally.  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  I do think even though there is a quite an elevation of the intermetatarsal angle that distal osteotomy would put it in a better functioning position and she is interested in this along with digital fusion of the right second toe.  I do want to see a little improvement in her A1c and I want to retake it again so she will reappoint again in a month and we will see where  things are and then order testing.  I do think she would heal well I educated her today on structural bunion and hammertoe correction and we will see her back again in 4 weeks or earlier if needed  X-rays indicate elevation of the 1-2 intermetatarsal angle approximate 16 degrees left foot and rigid contracture digit to right foot

## 2024-11-08 ENCOUNTER — Encounter: Payer: Self-pay | Admitting: Podiatry

## 2024-11-08 ENCOUNTER — Other Ambulatory Visit: Payer: Self-pay | Admitting: Podiatry

## 2024-11-08 ENCOUNTER — Ambulatory Visit: Admitting: Podiatry

## 2024-11-08 VITALS — Ht 64.0 in | Wt 154.0 lb

## 2024-11-08 DIAGNOSIS — E119 Type 2 diabetes mellitus without complications: Secondary | ICD-10-CM | POA: Diagnosis not present

## 2024-11-08 DIAGNOSIS — M21612 Bunion of left foot: Secondary | ICD-10-CM | POA: Diagnosis not present

## 2024-11-08 DIAGNOSIS — M2041 Other hammer toe(s) (acquired), right foot: Secondary | ICD-10-CM | POA: Diagnosis not present

## 2024-11-08 NOTE — Patient Instructions (Signed)

## 2024-11-09 LAB — COMPREHENSIVE METABOLIC PANEL WITH GFR
ALT: 13 IU/L (ref 0–32)
AST: 20 IU/L (ref 0–40)
Albumin: 4.6 g/dL (ref 3.8–4.8)
Alkaline Phosphatase: 72 IU/L (ref 49–135)
BUN/Creatinine Ratio: 17 (ref 12–28)
BUN: 17 mg/dL (ref 8–27)
Bilirubin Total: 0.5 mg/dL (ref 0.0–1.2)
CO2: 20 mmol/L (ref 20–29)
Calcium: 9.9 mg/dL (ref 8.7–10.3)
Chloride: 103 mmol/L (ref 96–106)
Creatinine, Ser: 0.99 mg/dL (ref 0.57–1.00)
Globulin, Total: 2.2 g/dL (ref 1.5–4.5)
Glucose: 113 mg/dL — ABNORMAL HIGH (ref 70–99)
Potassium: 4.7 mmol/L (ref 3.5–5.2)
Sodium: 140 mmol/L (ref 134–144)
Total Protein: 6.8 g/dL (ref 6.0–8.5)
eGFR: 59 mL/min/1.73 — ABNORMAL LOW

## 2024-11-09 LAB — HGB A1C W/O EAG: Hgb A1c MFr Bld: 7.3 % — ABNORMAL HIGH (ref 4.8–5.6)

## 2024-11-11 NOTE — Progress Notes (Signed)
 Subjective:   Patient ID: Cheyenne Perez, female   DOB: 79 y.o.   MRN: [id number]   HPI Patient presents with a chronic bunion deformity left that is increasingly sore for her and on the right has a rigidly contracted second toe which is also painful.  Patient states she has had history of diabetes she has been taking good care of herself with this and has not had her A1c or a metabolic panel in a fairly long period of time.  Would like to get this corrected has tried wider shoes she has tried cushioning the area and soaks without relief   ROS      Objective:  Physical Exam  Neurovascular status intact good digital perfusion bilateral feet are warm hair growth normal.  Patient has a significant structural bunion deformity left with redness around the joint surface and pain at the second toe right is rigid and painful when pressed with an inability to wear shoe gear comfortably.  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  I feel like with the toe if we can do it while she is healthy and the bunion that we will prevent further issues and I did go ahead today I ordered A1c and I ordered comp metabolic.  I did allow her to read consent form going over all alternative treatments and complications as outlined and she understands there is no guarantee of success of procedure and that I will not be able to get full correction of the bunion but I feel we will be able to put it into a position that it will be very stable long-term.  She wants to have this done she signed consent form I did dispense air fracture walker left properly fitted  to her lower leg and explained to her to begin wearing it at the current time to get used to it and find a shoe on the other foot that balances her well.  Patient will be scheduled once I see the results of her testing.  I did evaluate her blood work indicating that she has an A1c of 7.3 and her blood sugar was excellent and I do think she is a good risk even though she understands the risk of her diabetes

## 2024-11-15 ENCOUNTER — Telehealth: Payer: Self-pay | Admitting: Podiatry

## 2024-11-15 NOTE — Telephone Encounter (Signed)
 Patient called in inquiring if they needed to set up another appt with Dr. Magdalen and if he has had a chance to review the blood work taken at [facility] on 11/08/24. She inquired of the note in her chart indicating that Dr. Magdalen 'did evaluate her blood work indicating that she has an A1c of 7.3 and her blood sugar was excellent and I do think she is a good risk even though she understands the risk of her diabetes'-- asking for the risk being spoken about be clarified to her.

## 2024-12-01 ENCOUNTER — Encounter: Payer: Self-pay | Admitting: Podiatry
